# Patient Record
Sex: Female | Born: 1955 | Race: Black or African American | Hispanic: No | Marital: Single | Smoking: Never smoker
Health system: Southern US, Community
[De-identification: ages and names within clinical notes are randomized; demographics above are authoritative.]

## PROBLEM LIST (undated history)

## (undated) DIAGNOSIS — I10 Essential (primary) hypertension: Secondary | ICD-10-CM

---

## 2019-12-17 ENCOUNTER — Other Ambulatory Visit: Payer: Self-pay

## 2019-12-17 ENCOUNTER — Emergency Department (HOSPITAL_COMMUNITY): Payer: Self-pay

## 2019-12-17 ENCOUNTER — Encounter (HOSPITAL_COMMUNITY): Payer: Self-pay | Admitting: Emergency Medicine

## 2019-12-17 ENCOUNTER — Inpatient Hospital Stay (HOSPITAL_COMMUNITY)
Admission: EM | Admit: 2019-12-17 | Discharge: 2019-12-22 | DRG: 025 | Disposition: A | Discharge status: Home / Self Care | Payer: Self-pay | Attending: Neurological Surgery | Admitting: Neurological Surgery

## 2019-12-17 DIAGNOSIS — G911 Obstructive hydrocephalus: Secondary | ICD-10-CM | POA: Diagnosis present

## 2019-12-17 DIAGNOSIS — R270 Ataxia, unspecified: Secondary | ICD-10-CM | POA: Diagnosis present

## 2019-12-17 DIAGNOSIS — G935 Compression of brain: Secondary | ICD-10-CM | POA: Diagnosis present

## 2019-12-17 DIAGNOSIS — D496 Neoplasm of unspecified behavior of brain: Secondary | ICD-10-CM | POA: Diagnosis present

## 2019-12-17 DIAGNOSIS — D320 Benign neoplasm of cerebral meninges: Principal | ICD-10-CM | POA: Diagnosis present

## 2019-12-17 DIAGNOSIS — M25552 Pain in left hip: Secondary | ICD-10-CM | POA: Diagnosis present

## 2019-12-17 DIAGNOSIS — I10 Essential (primary) hypertension: Secondary | ICD-10-CM | POA: Diagnosis present

## 2019-12-17 DIAGNOSIS — R9089 Other abnormal findings on diagnostic imaging of central nervous system: Secondary | ICD-10-CM

## 2019-12-17 DIAGNOSIS — G9389 Other specified disorders of brain: Secondary | ICD-10-CM | POA: Diagnosis present

## 2019-12-17 DIAGNOSIS — Z20822 Contact with and (suspected) exposure to covid-19: Secondary | ICD-10-CM | POA: Diagnosis present

## 2019-12-17 HISTORY — DX: Essential (primary) hypertension: I10

## 2019-12-17 LAB — COMPREHENSIVE METABOLIC PANEL
ALT: 20 U/L (ref 0–44)
AST: 22 U/L (ref 15–41)
Albumin: 3.7 g/dL (ref 3.5–5.0)
Alkaline Phosphatase: 131 U/L — ABNORMAL HIGH (ref 38–126)
Anion gap: 10 (ref 5–15)
BUN: 14 mg/dL (ref 8–23)
CO2: 23 mmol/L (ref 22–32)
Calcium: 8.8 mg/dL — ABNORMAL LOW (ref 8.9–10.3)
Chloride: 108 mmol/L (ref 98–111)
Creatinine, Ser: 0.58 mg/dL (ref 0.44–1.00)
GFR calc Af Amer: >60 mL/min (ref >60)
GFR calc non Af Amer: >60 mL/min (ref >60)
Glucose, Bld: 168 mg/dL — ABNORMAL HIGH (ref 70–99)
Potassium: 3.4 mmol/L — ABNORMAL LOW (ref 3.5–5.1)
Sodium: 141 mmol/L (ref 135–145)
Total Bilirubin: 0.4 mg/dL (ref 0.3–1.2)
Total Protein: 7.8 g/dL (ref 6.5–8.1)

## 2019-12-17 LAB — CBC WITH DIFFERENTIAL/PLATELET
Abs Immature Granulocytes: 0.04 10*3/uL (ref 0.00–0.07)
Basophils Absolute: 0 10*3/uL (ref 0.0–0.1)
Basophils Relative: 0 %
Eosinophils Absolute: 0 10*3/uL (ref 0.0–0.5)
Eosinophils Relative: 0 %
HCT: 38.7 % (ref 36.0–46.0)
Hemoglobin: 12.8 g/dL (ref 12.0–15.0)
Immature Granulocytes: 0 %
Lymphocytes Relative: 17 %
Lymphs Abs: 1.8 10*3/uL (ref 0.7–4.0)
MCH: 25.1 pg — ABNORMAL LOW (ref 26.0–34.0)
MCHC: 33.1 g/dL (ref 30.0–36.0)
MCV: 76 fL — ABNORMAL LOW (ref 80.0–100.0)
Monocytes Absolute: 1 10*3/uL (ref 0.1–1.0)
Monocytes Relative: 9 %
Neutro Abs: 7.7 10*3/uL (ref 1.7–7.7)
Neutrophils Relative %: 74 %
Platelets: 279 10*3/uL (ref 150–400)
RBC: 5.09 MIL/uL (ref 3.87–5.11)
RDW: 13.2 % (ref 11.5–15.5)
WBC: 10.6 10*3/uL — ABNORMAL HIGH (ref 4.0–10.5)
nRBC: 0 % (ref 0.0–0.2)

## 2019-12-17 LAB — SARS CORONAVIRUS 2 BY RT PCR (HOSPITAL ORDER, PERFORMED IN ~~LOC~~ HOSPITAL LAB): SARS Coronavirus 2: NEGATIVE

## 2019-12-17 LAB — SURGICAL PCR SCREEN
MRSA, PCR: NEGATIVE
Staphylococcus aureus: NEGATIVE

## 2019-12-17 MED ORDER — DEXAMETHASONE SODIUM PHOSPHATE 10 MG/ML IJ SOLN
10.0000 mg | Freq: Once | INTRAMUSCULAR | Status: AC
  Administered 2019-12-17: 10 mg via INTRAVENOUS
  Filled 2019-12-17: qty 1

## 2019-12-17 MED ORDER — MORPHINE SULFATE (PF) 4 MG/ML IV SOLN
4.0000 mg | Freq: Once | INTRAVENOUS | Status: AC
  Administered 2019-12-17: 4 mg via INTRAVENOUS
  Filled 2019-12-17: qty 1

## 2019-12-17 MED ORDER — CHLORHEXIDINE GLUCONATE CLOTH 2 % EX PADS
6.0000 | MEDICATED_PAD | Freq: Every day | CUTANEOUS | Status: DC
  Administered 2019-12-17 – 2019-12-21 (×5): 6 via TOPICAL

## 2019-12-17 MED ORDER — HYDROMORPHONE HCL 1 MG/ML IJ SOLN
1.0000 mg | Freq: Once | INTRAMUSCULAR | Status: AC
  Administered 2019-12-17: 1 mg via INTRAVENOUS
  Filled 2019-12-17: qty 1

## 2019-12-17 MED ORDER — ONDANSETRON HCL 4 MG/2ML IJ SOLN
4.0000 mg | Freq: Once | INTRAMUSCULAR | Status: AC
  Administered 2019-12-17: 4 mg via INTRAVENOUS
  Filled 2019-12-17: qty 2

## 2019-12-17 NOTE — ED Notes (Signed)
Report attempted to call report to floor but no answer.

## 2019-12-17 NOTE — ED Notes (Signed)
Patient is also complaining of left hip pain. Patient state she step off wrong on the side walk. Patient state she is having difficulties put weight on it.

## 2019-12-17 NOTE — ED Notes (Signed)
Request for [facility] transport per Dr Kathrynn Humble completed. [employer]

## 2019-12-17 NOTE — ED Notes (Signed)
Pt repositioned in the bed. Pt has snack at bedside

## 2019-12-17 NOTE — ED Notes (Signed)
Report given to Jasa from [employer].

## 2019-12-17 NOTE — ED Triage Notes (Signed)
Patient here from home reporting headache x2 days. Seen by PCP with no relief. Daughter interpreter. Denies n/v,  AAO x4. Denies falling.

## 2019-12-17 NOTE — ED Notes (Signed)
ED TO INPATIENT HANDOFF REPORT  ED Nurse Name and Phone #: [phone]  S Name/Age/Gender Belinda Berg 64 y.o. female Room/Bed: WA15/WA15  Code Status   Code Status: Not on file  Home/SNF/Other Home Patient oriented to: self, place, time and situation Is this baseline? Yes   Triage Complete: Triage complete  Chief Complaint Brain tumor [facility]) [D49.6]  Triage Note Patient here from home reporting headache x2 days. Seen by PCP with no relief. Daughter interpreter. Denies n/v,  AAO x4. Denies falling.     Allergies No Known Allergies  Level of Care/Admitting Diagnosis ED Disposition    ED Disposition Condition [facility]: [place] [[id number]]  Level of Care: ICU [6]  May admit patient to [facility] or [facility] if equivalent level of care is available:: No  Covid Evaluation: Asymptomatic Screening Protocol (No Symptoms)  [facility]: [id number]  Admitting Physician: [place], [place]  Attending Physician: Erline Levine [[id number]]  Estimated length of stay: past midnight tomorrow  Certification:: I certify this patient will need inpatient services for at least 2 midnights       B Medical/Surgery History Past Medical History:  Diagnosis Date  . Hypertension    History reviewed. No pertinent surgical history.   A IV Location/Drains/Wounds Patient Lines/Drains/Airways Status   Active Line/Drains/Airways    Name:   Placement date:   Placement time:   Site:   Days:   Peripheral IV 12/17/19 Left Antecubital   12/17/19    1702    Antecubital   less than 1          Intake/Output Last 24 hours No intake or output data in the 24 hours ending 12/17/19 1915  Labs/Imaging Results for orders placed or performed during the hospital encounter of 12/17/19 (from the past 48 hour(s))  Comprehensive metabolic panel     Status: Abnormal   Collection Time: 12/17/19  5:02 PM  Result Value Ref Range   Sodium 141 135 - 145  mmol/L   Potassium 3.4 (L) 3.5 - 5.1 mmol/L   Chloride 108 98 - 111 mmol/L   CO2 23 22 - 32 mmol/L   Glucose, Bld 168 (H) 70 - 99 mg/dL    Comment: Glucose reference range applies only to samples taken after fasting for at least 8 hours.   BUN 14 8 - 23 mg/dL   Creatinine, Ser 0.58 0.44 - 1.00 mg/dL   Calcium 8.8 (L) 8.9 - 10.3 mg/dL   Total Protein 7.8 6.5 - 8.1 g/dL   Albumin 3.7 3.5 - 5.0 g/dL   AST 22 15 - 41 U/L   ALT 20 0 - 44 U/L   Alkaline Phosphatase 131 (H) 38 - 126 U/L   Total Bilirubin 0.4 0.3 - 1.2 mg/dL   GFR calc non Af Amer >60 >60 mL/min   GFR calc Af Amer >60 >60 mL/min   Anion gap 10 5 - 15    Comment: Performed at [facility], [place] [address] [place], [place] [address]  CBC with Differential     Status: Abnormal   Collection Time: 12/17/19  5:02 PM  Result Value Ref Range   WBC 10.6 (H) 4.0 - 10.5 K/uL   RBC 5.09 3.87 - 5.11 MIL/uL   Hemoglobin 12.8 12.0 - 15.0 g/dL   HCT 38.7 36.0 - 46.0 %   MCV 76.0 (L) 80.0 - 100.0 fL   MCH 25.1 (L) 26.0 - 34.0 pg  MCHC 33.1 30.0 - 36.0 g/dL   RDW 13.2 11.5 - 15.5 %   Platelets 279 150 - 400 K/uL   nRBC 0.0 0.0 - 0.2 %   Neutrophils Relative % 74 %   Neutro Abs 7.7 1.7 - 7.7 K/uL   Lymphocytes Relative 17 %   Lymphs Abs 1.8 0.7 - 4.0 K/uL   Monocytes Relative 9 %   Monocytes Absolute 1.0 0.1 - 1.0 K/uL   Eosinophils Relative 0 %   Eosinophils Absolute 0.0 0.0 - 0.5 K/uL   Basophils Relative 0 %   Basophils Absolute 0.0 0.0 - 0.1 K/uL   Immature Granulocytes 0 %   Abs Immature Granulocytes 0.04 0.00 - 0.07 K/uL    Comment: Performed at [facility], [place] [address] [place], [place] [address]   CT Head Wo Contrast  Result Date: 12/17/2019 CLINICAL DATA:  Headache. EXAM: CT HEAD WITHOUT CONTRAST TECHNIQUE: Contiguous axial images were obtained from the base of the skull through the vertex without intravenous contrast. COMPARISON:  None. FINDINGS: Brain: There is a large round  partially calcified mass within the RIGHT cerebellum measuring 3.6 by 3.8 by 3.5 cm. The mass has dense central calcification and wispy peripheral calcification. Mass is fairly well-circumscribed and abuts the dural surfaces. There is mass effect exerted by the calcified lesion with vasogenic edema within the RIGHT cerebellum and compression of the fourth ventricle. Additionally there is moderate to severe hydrocephalus of the lateral ventricles and third ventricle. Vascular: No hyperdense vessel or unexpected calcification. Skull: No clear bony changes adjacent to the partially calcified RIGHT cerebellar mass. Sinuses/Orbits: Unremarkable Other: none IMPRESSION: 1. 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 Mass exerts mass effect with compression of the fourth ventricle with moderate to severe hydronephrosis of the lateral ventricles and third ventricle. Recommend neuro surgical consultation. 2. No clear bony changes adjacent to the RIGHT cerebral mass. Mass does abut the dural surface therefore meningioma is a consideration however other benign and malignant primary brain lesions need to be considered. Recommend MRI with and without contrast for further evaluation. Critical Value/emergent results were called by telephone at the time of interpretation on 12/17/2019 at 5:33 pm to provider [facility] PATEL , who verbally acknowledged these results. Electronically Signed   By: Suzy Bouchard M.D.   On: 12/17/2019 17:34   DG Hip Unilat With Pelvis 2-3 Views Left  Result Date: 12/17/2019 CLINICAL DATA:  64 year old female with left hip pain. EXAM: DG HIP (WITH OR WITHOUT PELVIS) 2-3V LEFT COMPARISON:  None. FINDINGS: There is no acute fracture or dislocation. No significant arthritic changes. The soft tissues are unremarkable. IMPRESSION: Negative. Electronically Signed   By: Anner Crete M.D.   On: 12/17/2019 17:26    Pending Labs Unresulted Labs (From admission, onward)    Start     Ordered    12/17/19 1827  SARS Coronavirus 2 by RT PCR (hospital order, performed in [facility] hospital lab) Nasopharyngeal Nasopharyngeal Swab  (Tier 2 (TAT 2 hrs))  Once,   STAT    Question Answer Comment  Is this test for diagnosis or screening Screening   Symptomatic for COVID-19 as defined by CDC No   Hospitalized for COVID-19 No   Admitted to ICU for COVID-19 No   Previously tested for COVID-19 No   Resident in a congregate (group) care setting No   Employed in healthcare setting No   Pregnant No   Has patient completed COVID vaccination(s) (2 doses of Pfizer/Moderna 1 dose  of Toma Aran) Unknown      12/17/19 1826          Vitals/Pain Today's Vitals   12/17/19 1756 12/17/19 1800 12/17/19 1840 12/17/19 1852  BP: (!) 154/95 (!) 155/98 (!) 155/98   Pulse: 96 94 94   Resp: 20 20 16    Temp:      TempSrc:      SpO2: 98% 97% 96%   Height:      PainSc:    10-Worst pain ever    Isolation Precautions No active isolations  Medications Medications  morphine 4 MG/ML injection 4 mg (4 mg Intravenous Given 12/17/19 1729)  dexamethasone (DECADRON) injection 10 mg (10 mg Intravenous Given 12/17/19 1852)  HYDROmorphone (DILAUDID) injection 1 mg (1 mg Intravenous Given 12/17/19 1851)    Mobility walks Low fall risk   Focused Assessments .   R Recommendations: See Admitting Provider Note  Report given to:   Additional Notes: n/a

## 2019-12-17 NOTE — ED Provider Notes (Signed)
[place] DEPT Provider Note   CSN: [id number] Arrival date & time: 12/17/19  1102     History Chief Complaint  Patient presents with  . Headache    Belinda Berg is a 64 y.o. female with pertinent past medical history of hypertension that presents the emergency department today for headache and left hip pain.  Patient states that she has been having a headache that is been progressing and worsening over the past week, states that this is a new headache.  States that headache is in the front of her head and the back of her head.  Also admits to 2 episodes of vomiting one last night and one this morning.  No hematemesis or coffee-ground emesis.  Denies any nausea right now.  Patient has been taking blood pressure medications as prescribed.  Is compliant.  States that she has been taking her blood pressure at home and has been around [id number] systolic.  This is normal for her.  Patient states that she has no vision changes, diplopia, gait changes, weakness, dizziness, lightheadedness, syncope, trauma to the head, fevers, chills, paresthesias, abdominal pain.  States she is been eating normally drinking normally.  No dysuria or hematuria.  Normal stools.  Denies any chest pain or shortness of breath.  Patient does not have a history of migraines.  In regards to left hip, patient states that she was walking and missed a step on a curb and started having left hip pain.  Denies any falls.  Is not any blood thinners.  Denies any sensation of popping noise to hip.  Denies any pain radiation down her leg.  Denies any back pain.  Is ambulatory.  Has never injured that hip before.    HPI     Past Medical History:  Diagnosis Date  . Hypertension     There are no problems to display for this patient.   History reviewed. No pertinent surgical history.   OB History   No obstetric history on file.     No family history on file.  Social History   Tobacco Use  .  Smoking status: Never Smoker  . Smokeless tobacco: Never Used  Substance Use Topics  . Alcohol use: Never  . Drug use: Never    Home Medications Prior to Admission medications   Medication Sig Start Date End Date Taking? Authorizing Provider  amLODipine (NORVASC) 10 MG tablet Take 10 mg by mouth daily.   Yes [provider]  atorvastatin (LIPITOR) 40 MG tablet Take 40 mg by mouth daily.   Yes [provider]  carvedilol (COREG) 3.125 MG tablet Take 3.125 mg by mouth 2 (two) times daily with a meal.   Yes [provider]  ibuprofen (ADVIL) 200 MG tablet Take 400 mg by mouth every 6 (six) hours as needed for moderate pain.   Yes [provider]  Multiple Vitamin (MULTIVITAMIN WITH MINERALS) TABS tablet Take 1 tablet by mouth daily.   Yes [provider]  omeprazole (PRILOSEC) 20 MG capsule Take 20 mg by mouth daily.   Yes [provider]    Allergies    Patient has no known allergies.  Review of Systems   Review of Systems  Constitutional: Negative for chills, diaphoresis, fatigue and fever.  HENT: Negative for congestion, sore throat and trouble swallowing.   Eyes: Negative for pain and visual disturbance.  Respiratory: Negative for cough, shortness of breath and wheezing.   Cardiovascular: Negative for chest pain, palpitations and  leg swelling.  Gastrointestinal: Negative for abdominal distention, abdominal pain, diarrhea, nausea and vomiting.  Genitourinary: Negative for difficulty urinating.  Musculoskeletal: Positive for arthralgias (L hip pain). Negative for back pain, neck pain and neck stiffness.  Skin: Negative for pallor.  Neurological: Positive for headaches. Negative for dizziness, tremors, seizures, syncope, facial asymmetry, speech difficulty, weakness, light-headedness and numbness.  Psychiatric/Behavioral: Negative for confusion.    Physical Exam Updated Vital Signs BP (!) 154/95   Pulse 96   Temp 98.2 F  (36.8 C) (Oral)   Resp 20   Ht 5' (1.524 m)   SpO2 98%   Physical Exam Constitutional:      General: She is not in acute distress.    Appearance: Normal appearance. She is not ill-appearing, toxic-appearing or diaphoretic.  HENT:     Mouth/Throat:     Mouth: Mucous membranes are moist.     Pharynx: Oropharynx is clear.  Eyes:     General: No visual field deficit or scleral icterus.    Extraocular Movements: Extraocular movements intact.     Pupils: Pupils are equal, round, and reactive to light.  Cardiovascular:     Rate and Rhythm: Normal rate and regular rhythm.     Pulses: Normal pulses.     Heart sounds: Normal heart sounds.  Pulmonary:     Effort: Pulmonary effort is normal. No respiratory distress.     Breath sounds: Normal breath sounds. No stridor. No wheezing, rhonchi or rales.  Chest:     Chest wall: No tenderness.  Abdominal:     General: Abdomen is flat. There is no distension.     Palpations: Abdomen is soft.     Tenderness: There is no abdominal tenderness. There is no guarding or rebound.  Musculoskeletal:        General: No swelling or tenderness. Normal range of motion.     Cervical back: Normal range of motion and neck supple. No rigidity.     Right lower leg: No edema.     Left lower leg: No edema.     Comments: Patient with tenderness to left hip.  No overlying signs of infection.  Patient is able to move hip in all directions.  Normal strength of hip, ankle.  Normal sensation.  Normal gait.  Skin:    General: Skin is warm and dry.     Capillary Refill: Capillary refill takes less than 2 seconds.     Coloration: Skin is not pale.  Neurological:     General: No focal deficit present.     Mental Status: She is alert and oriented to person, place, and time.     Cranial Nerves: Cranial nerves are intact. No cranial nerve deficit, dysarthria or facial asymmetry.     Sensory: Sensation is intact. No sensory deficit.     Motor: Motor function is intact. No  weakness, atrophy or seizure activity.     Coordination: Coordination is intact. Romberg sign negative. Coordination normal. Finger-Nose-Finger Test normal.     Gait: Gait is intact.     Comments: Alert. Clear speech. No facial droop. CNIII-XII grossly intact. Bilateral upper and lower extremities' sensation grossly intact. 5/5 symmetric strength with grip strength and with plantar and dorsi flexion bilaterally. Patellar DTRs are 2+ and symmetric . Normal finger to nose bilaterally. Negative pronator drift. Negative Romberg sign. Gait is steady and intact.  Patient with tenderness to forehead bilaterally with tenderness to occipital lobes bilaterally.  Psychiatric:  Mood and Affect: Mood normal.        Behavior: Behavior normal.     ED Results / Procedures / Treatments   Labs (all labs ordered are listed, but only abnormal results are displayed) Labs Reviewed  COMPREHENSIVE METABOLIC PANEL - Abnormal; Notable for the following components:      Result Value   Potassium 3.4 (*)    Glucose, Bld 168 (*)    Calcium 8.8 (*)    Alkaline Phosphatase 131 (*)    All other components within normal limits  CBC WITH DIFFERENTIAL/PLATELET - Abnormal; Notable for the following components:   WBC 10.6 (*)    MCV 76.0 (*)    MCH 25.1 (*)    All other components within normal limits    EKG None  Radiology CT Head Wo Contrast  Result Date: 12/17/2019 CLINICAL DATA:  Headache. EXAM: CT HEAD WITHOUT CONTRAST TECHNIQUE: Contiguous axial images were obtained from the base of the skull through the vertex without intravenous contrast. COMPARISON:  None. FINDINGS: Brain: There is a large round partially calcified mass within the RIGHT cerebellum measuring 3.6 by 3.8 by 3.5 cm. The mass has dense central calcification and wispy peripheral calcification. Mass is fairly well-circumscribed and abuts the dural surfaces. There is mass effect exerted by the calcified lesion with vasogenic edema within the  RIGHT cerebellum and compression of the fourth ventricle. Additionally there is moderate to severe hydrocephalus of the lateral ventricles and third ventricle. Vascular: No hyperdense vessel or unexpected calcification. Skull: No clear bony changes adjacent to the partially calcified RIGHT cerebellar mass. Sinuses/Orbits: Unremarkable Other: none IMPRESSION: 1. 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 Mass exerts mass effect with compression of the fourth ventricle with moderate to severe hydronephrosis of the lateral ventricles and third ventricle. Recommend neuro surgical consultation. 2. No clear bony changes adjacent to the RIGHT cerebral mass. Mass does abut the dural surface therefore meningioma is a consideration however other benign and malignant primary brain lesions need to be considered. Recommend MRI with and without contrast for further evaluation. Critical Value/emergent results were called by telephone at the time of interpretation on 12/17/2019 at 5:33 pm to provider Belinda Berg , who verbally acknowledged these results. Electronically Signed   By: Suzy Bouchard M.D.   On: 12/17/2019 17:34   DG Hip Unilat With Pelvis 2-3 Views Left  Result Date: 12/17/2019 CLINICAL DATA:  64 year old female with left hip pain. EXAM: DG HIP (WITH OR WITHOUT PELVIS) 2-3V LEFT COMPARISON:  None. FINDINGS: There is no acute fracture or dislocation. No significant arthritic changes. The soft tissues are unremarkable. IMPRESSION: Negative. Electronically Signed   By: Anner Crete M.D.   On: 12/17/2019 17:26    Procedures Procedures (including critical care time)  Medications Ordered in ED Medications  morphine 4 MG/ML injection 4 mg (4 mg Intravenous Given 12/17/19 1729)    ED Course  I have reviewed the triage vital signs and the nursing notes.  Pertinent labs & imaging results that were available during my care of the patient were reviewed by me and considered in my medical decision  making (see chart for details).    MDM Rules/Calculators/A&P                     Sochil Dronen is a 64 y.o. female with pertinent past medical history of hypertension that presents the emergency department today for headache and hip pain.  Normal neuro exam. Pt is ambulatory.  Initial interventions Morphine  for pain.   Ct head showed large round partially calcified right cerebellar mass with mass-effect and compression of the fourth ventricle with severe hydronephrosis of the lateral ventricles and third ventricle.  Will consult neurosurgery at this time.  Discussed the findings with patient's daughter in depth.  Expressed that we need more information and to speak to neurosurgery at this time.  Daughter is tearful and does not want to tell mom.  Expressed to daughter that we need to legally tell her mom, spoke to patient's son who was reasonable and agreeable to tell mom.  Daughter translated to mom the findings of the CAT scan and need for further evaluation at this time.  Mom agreeable.  Does not have any questions at this time.  Expressed that she may be in the hospital for a while due to the tumor size.  Daughter and patient both agreeable at this time.  Upon reassessment pain has been controlled with morphine.  Negative x-rays of the hip.  Labs stable at this time. Marland Kitchen  6:25 PM discussed case with Dr. Vertell Limber, neurosurgeon who wants pt's admitted to Neuro ICU.   Pt care was handed off to Dr. Kathrynn Humble who will follow up with admission.  Complete history and physical and current plan have been communicated.  Please refer to their note for the remainder of ED care and ultimate disposition.   Final Clinical Impression(s) / ED Diagnoses Final diagnoses:  Cerebellar mass  Abnormal CT of brain    Rx / DC Orders ED Discharge Orders    None       Alfredia Client, PA-C 12/17/19 2042    Varney Biles, MD 12/18/19 1529

## 2019-12-17 NOTE — ED Notes (Signed)
[facility] here for transport of pt to [facility]

## 2019-12-18 ENCOUNTER — Inpatient Hospital Stay (HOSPITAL_COMMUNITY): Payer: Self-pay

## 2019-12-18 LAB — COMPREHENSIVE METABOLIC PANEL
ALT: 20 U/L (ref 0–44)
AST: 21 U/L (ref 15–41)
Albumin: 3.4 g/dL — ABNORMAL LOW (ref 3.5–5.0)
Alkaline Phosphatase: 127 U/L — ABNORMAL HIGH (ref 38–126)
Anion gap: 11 (ref 5–15)
BUN: 18 mg/dL (ref 8–23)
CO2: 23 mmol/L (ref 22–32)
Calcium: 9.2 mg/dL (ref 8.9–10.3)
Chloride: 107 mmol/L (ref 98–111)
Creatinine, Ser: 0.66 mg/dL (ref 0.44–1.00)
GFR calc Af Amer: >60 mL/min (ref >60)
GFR calc non Af Amer: >60 mL/min (ref >60)
Glucose, Bld: 162 mg/dL — ABNORMAL HIGH (ref 70–99)
Potassium: 3.6 mmol/L (ref 3.5–5.1)
Sodium: 141 mmol/L (ref 135–145)
Total Bilirubin: 0.6 mg/dL (ref 0.3–1.2)
Total Protein: 8.1 g/dL (ref 6.5–8.1)

## 2019-12-18 LAB — CBC
HCT: 39.7 % (ref 36.0–46.0)
Hemoglobin: 13 g/dL (ref 12.0–15.0)
MCH: 25.2 pg — ABNORMAL LOW (ref 26.0–34.0)
MCHC: 32.7 g/dL (ref 30.0–36.0)
MCV: 77.1 fL — ABNORMAL LOW (ref 80.0–100.0)
Platelets: 297 10*3/uL (ref 150–400)
RBC: 5.15 MIL/uL — ABNORMAL HIGH (ref 3.87–5.11)
RDW: 13.5 % (ref 11.5–15.5)
WBC: 13.2 10*3/uL — ABNORMAL HIGH (ref 4.0–10.5)
nRBC: 0 % (ref 0.0–0.2)

## 2019-12-18 LAB — HIV ANTIBODY (ROUTINE TESTING W REFLEX): HIV Screen 4th Generation wRfx: NONREACTIVE

## 2019-12-18 MED ORDER — HYDROMORPHONE HCL 1 MG/ML IJ SOLN
0.5000 mg | INTRAMUSCULAR | Status: DC | PRN
  Administered 2019-12-18 – 2019-12-20 (×4): 1 mg via INTRAVENOUS
  Filled 2019-12-18 (×4): qty 1

## 2019-12-18 MED ORDER — ATORVASTATIN CALCIUM 40 MG PO TABS
40.0000 mg | ORAL_TABLET | Freq: Every day | ORAL | Status: DC
  Administered 2019-12-18 – 2019-12-22 (×4): 40 mg via ORAL
  Filled 2019-12-18 (×5): qty 1

## 2019-12-18 MED ORDER — POLYETHYLENE GLYCOL 3350 17 G PO PACK
17.0000 g | PACK | Freq: Every day | ORAL | Status: DC | PRN
  Administered 2019-12-22: 17 g via ORAL
  Filled 2019-12-18: qty 1

## 2019-12-18 MED ORDER — POTASSIUM CHLORIDE IN NACL 20-0.9 MEQ/L-% IV SOLN
INTRAVENOUS | Status: DC
  Filled 2019-12-18: qty 1000

## 2019-12-18 MED ORDER — DOCUSATE SODIUM 100 MG PO CAPS
100.0000 mg | ORAL_CAPSULE | Freq: Two times a day (BID) | ORAL | Status: DC
  Administered 2019-12-18 – 2019-12-22 (×8): 100 mg via ORAL
  Filled 2019-12-18 (×8): qty 1

## 2019-12-18 MED ORDER — GADOBUTROL 1 MMOL/ML IV SOLN
7.0000 mL | Freq: Once | INTRAVENOUS | Status: AC | PRN
  Administered 2019-12-18: 7 mL via INTRAVENOUS

## 2019-12-18 MED ORDER — PANTOPRAZOLE SODIUM 40 MG PO TBEC
40.0000 mg | DELAYED_RELEASE_TABLET | Freq: Every day | ORAL | Status: DC
  Administered 2019-12-18 – 2019-12-22 (×4): 40 mg via ORAL
  Filled 2019-12-18 (×4): qty 1

## 2019-12-18 MED ORDER — FLEET ENEMA 7-19 GM/118ML RE ENEM: 1.0000 | ENEMA | Freq: Once | RECTAL | Status: DC | PRN

## 2019-12-18 MED ORDER — CARVEDILOL 3.125 MG PO TABS
3.1250 mg | ORAL_TABLET | Freq: Two times a day (BID) | ORAL | Status: DC
  Administered 2019-12-18 – 2019-12-22 (×9): 3.125 mg via ORAL
  Filled 2019-12-18 (×9): qty 1

## 2019-12-18 MED ORDER — ACETAMINOPHEN 650 MG RE SUPP: 650.0000 mg | Freq: Four times a day (QID) | RECTAL | Status: DC | PRN

## 2019-12-18 MED ORDER — HYDROCODONE-ACETAMINOPHEN 5-325 MG PO TABS
1.0000 | ORAL_TABLET | ORAL | Status: DC | PRN
  Administered 2019-12-18 – 2019-12-22 (×10): 2 via ORAL
  Filled 2019-12-18 (×10): qty 2

## 2019-12-18 MED ORDER — ACETAMINOPHEN 325 MG PO TABS
650.0000 mg | ORAL_TABLET | Freq: Four times a day (QID) | ORAL | Status: DC | PRN
  Administered 2019-12-19 – 2019-12-20 (×3): 650 mg via ORAL
  Filled 2019-12-18 (×3): qty 2

## 2019-12-18 MED ORDER — DEXAMETHASONE SODIUM PHOSPHATE 4 MG/ML IJ SOLN
4.0000 mg | Freq: Four times a day (QID) | INTRAMUSCULAR | Status: AC
  Administered 2019-12-18 – 2019-12-20 (×11): 4 mg via INTRAVENOUS
  Filled 2019-12-18 (×11): qty 1

## 2019-12-18 MED ORDER — AMLODIPINE BESYLATE 10 MG PO TABS
10.0000 mg | ORAL_TABLET | Freq: Every day | ORAL | Status: DC
  Administered 2019-12-18 – 2019-12-22 (×5): 10 mg via ORAL
  Filled 2019-12-18 (×5): qty 1

## 2019-12-18 MED ORDER — ONDANSETRON HCL 4 MG PO TABS: 4.0000 mg | ORAL_TABLET | Freq: Four times a day (QID) | ORAL | Status: DC | PRN

## 2019-12-18 MED ORDER — BISACODYL 10 MG RE SUPP: 10.0000 mg | Freq: Every day | RECTAL | Status: DC | PRN

## 2019-12-18 MED ORDER — ADULT MULTIVITAMIN W/MINERALS CH
1.0000 | ORAL_TABLET | Freq: Every day | ORAL | Status: DC
  Administered 2019-12-19 – 2019-12-22 (×3): 1 via ORAL
  Filled 2019-12-18 (×3): qty 1

## 2019-12-18 MED ORDER — ONDANSETRON HCL 4 MG/2ML IJ SOLN: 4.0000 mg | Freq: Four times a day (QID) | INTRAMUSCULAR | Status: DC | PRN

## 2019-12-18 NOTE — Progress Notes (Signed)
Called MRI, spoke to [facility], told her we are ready to come down for MRI as soon as they can accomidate [place]. They then called back and asked for her family to come in as interpreter in person. I will call family now.

## 2019-12-18 NOTE — H&P (Signed)
Reason for Consult:cerebellar mass and obstructive hydrocephalus. Referring Physician: Yeraldi Albani is an 64 y.o. female.  HPI: Belinda Berg is an 64 year old female who presented to [facility] emergency department for a 10/10 headache. A CT head was performed at [facility] and showed a large calcified mass in the right cerebellar hemisphere with compression of the 4th ventricle and obstructive hydrocephalus which subsequently lead to a neurosurgical evaluation. The patient was then transferred to neuro ICU at [facility].     The patient is non-English speaking. The patient's daughter Minette Brine is present in the room translating during the exam and providing additional history.  She is from [place] and speaks [place].  About one week ago the patient was walking and lost her balance which caused her to stumble but not fall. After her misstep, she began to have pain in her left hip. She began taking acetaminophen and ibuprofen for the hip pain. About two days ago, she began complaining of a severe headache as well as left hip pain. She continued taking OTC analgesics with little relief from the headache pain. The headache pain continued to increase in severity with a 10/10 pain which prompted a call to her PCP. The PCP instructed the patient to go to the ED and have a head CT. While in the ED, a radiograph of her pelvis was done as well as a CT head.     While in the ED, she received 10 mg of Decadron, 4 mg of morphine, and 1 mg of dilaudud which resulted in a slight decrease in her headache. She had one episode of emesis and 4 mg of Zofran was given. Upon questioning the patient, she reported having headaches for the last five years but not as severe as the last few days. She denies any other neurological deficits.    On discussion with the patient, she admits to progressively worsening headache over the last five years.    Past Medical History:  Diagnosis Date  . Hypertension      History reviewed. No pertinent surgical history.  No family history on file.  Social History:  reports that she has never smoked. She has never used smokeless tobacco. She reports that she does not drink alcohol or use drugs.  Allergies: No Known Allergies  Medications: I have reviewed the patient's current medications.  Results for orders placed or performed during the hospital encounter of 12/17/19 (from the past 48 hour(s))  Comprehensive metabolic panel     Status: Abnormal   Collection Time: 12/17/19  5:02 PM  Result Value Ref Range   Sodium 141 135 - 145 mmol/L   Potassium 3.4 (L) 3.5 - 5.1 mmol/L   Chloride 108 98 - 111 mmol/L   CO2 23 22 - 32 mmol/L   Glucose, Bld 168 (H) 70 - 99 mg/dL    Comment: Glucose reference range applies only to samples taken after fasting for at least 8 hours.   BUN 14 8 - 23 mg/dL   Creatinine, Ser 0.58 0.44 - 1.00 mg/dL   Calcium 8.8 (L) 8.9 - 10.3 mg/dL   Total Protein 7.8 6.5 - 8.1 g/dL   Albumin 3.7 3.5 - 5.0 g/dL   AST 22 15 - 41 U/L   ALT 20 0 - 44 U/L   Alkaline Phosphatase 131 (H) 38 - 126 U/L   Total Bilirubin 0.4 0.3 - 1.2 mg/dL   GFR calc non Af Amer >60 >60 mL/min   GFR calc Af Amer >  60 >60 mL/min   Anion gap 10 5 - 15    Comment: Performed at [facility], [place] [address], [place], [place] [address]  CBC with Differential     Status: Abnormal   Collection Time: 12/17/19  5:02 PM  Result Value Ref Range   WBC 10.6 (H) 4.0 - 10.5 K/uL   RBC 5.09 3.87 - 5.11 MIL/uL   Hemoglobin 12.8 12.0 - 15.0 g/dL   HCT 38.7 36.0 - 46.0 %   MCV 76.0 (L) 80.0 - 100.0 fL   MCH 25.1 (L) 26.0 - 34.0 pg   MCHC 33.1 30.0 - 36.0 g/dL   RDW 13.2 11.5 - 15.5 %   Platelets 279 150 - 400 K/uL   nRBC 0.0 0.0 - 0.2 %   Neutrophils Relative % 74 %   Neutro Abs 7.7 1.7 - 7.7 K/uL   Lymphocytes Relative 17 %   Lymphs Abs 1.8 0.7 - 4.0 K/uL   Monocytes Relative 9 %   Monocytes Absolute 1.0 0.1 - 1.0 K/uL   Eosinophils Relative 0  %   Eosinophils Absolute 0.0 0.0 - 0.5 K/uL   Basophils Relative 0 %   Basophils Absolute 0.0 0.0 - 0.1 K/uL   Immature Granulocytes 0 %   Abs Immature Granulocytes 0.04 0.00 - 0.07 K/uL    Comment: Performed at [facility], [place] [address] [place], [place] [address]  SARS Coronavirus 2 by RT PCR (hospital order, performed in [facility] hospital lab) Nasopharyngeal Nasopharyngeal Swab     Status: None   Collection Time: 12/17/19  7:17 PM   Specimen: Nasopharyngeal Swab  Result Value Ref Range   SARS Coronavirus 2 NEGATIVE NEGATIVE    Comment: (NOTE) SARS-CoV-2 target nucleic acids are NOT DETECTED. The SARS-CoV-2 RNA is generally detectable in upper and lower respiratory specimens during the acute phase of infection. The lowest concentration of SARS-CoV-2 viral copies this assay can detect is 250 copies / mL. A negative result does not preclude SARS-CoV-2 infection and should not be used as the sole basis for treatment or other patient management decisions.  A negative result may occur with improper specimen collection / handling, submission of specimen other than nasopharyngeal swab, presence of viral mutation(s) within the areas targeted by this assay, and inadequate number of viral copies (<250 copies / mL). A negative result must be combined with clinical observations, patient history, and epidemiological information. Fact Sheet for Patients:   StrictlyIdeas.no Fact Sheet for Healthcare Providers: BankingDealers.co.za This test is not yet approved or cleared  by the [place] FDA and has been authorized for detection and/or diagnosis of SARS-CoV-2 by FDA under an Emergency Use Authorization (EUA).  This EUA will remain in effect (meaning this test can be used) for the duration of the COVID-19 declaration under Section 564(b)(1) of the Act, 21 U.S.C. section 360bbb-3(b)(1), unless the authorization is  terminated or revoked sooner. Performed at [facility], [place] [address] [place], [place] [address]   Surgical pcr screen     Status: None   Collection Time: 12/17/19  8:45 PM   Specimen: Nasal Mucosa; Nasal Swab  Result Value Ref Range   MRSA, PCR NEGATIVE NEGATIVE   Staphylococcus aureus NEGATIVE NEGATIVE    Comment: (NOTE) The Xpert SA Assay (FDA approved for NASAL specimens in patients 17 years of age and older), is one component of a comprehensive surveillance program. It is not intended to diagnose infection nor to guide or monitor treatment.  Performed at [facility], [place] [address] [place],  [address]     CT Head Wo Contrast  Result Date: 12/17/2019 CLINICAL DATA:  Headache. EXAM: CT HEAD WITHOUT CONTRAST TECHNIQUE: Contiguous axial images were obtained from the base of the skull through the vertex without intravenous contrast. COMPARISON:  None. FINDINGS: Brain: There is a large round partially calcified mass within the RIGHT cerebellum measuring 3.6 by 3.8 by 3.5 cm. The mass has dense central calcification and wispy peripheral calcification. Mass is fairly well-circumscribed and abuts the dural surfaces. There is mass effect exerted by the calcified lesion with vasogenic edema within the RIGHT cerebellum and compression of the fourth ventricle. Additionally there is moderate to severe hydrocephalus of the lateral ventricles and third ventricle. Vascular: No hyperdense vessel or unexpected calcification. Skull: No clear bony changes adjacent to the partially calcified RIGHT cerebellar mass. Sinuses/Orbits: Unremarkable Other: none IMPRESSION: 1. 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 Mass exerts mass effect with compression of the fourth ventricle with moderate to severe hydronephrosis of the lateral ventricles and third ventricle. Recommend neuro surgical consultation. 2. No clear bony changes adjacent to the RIGHT cerebral mass. Mass  does abut the dural surface therefore meningioma is a consideration however other benign and malignant primary brain lesions need to be considered. Recommend MRI with and without contrast for further evaluation. Critical Value/emergent results were called by telephone at the time of interpretation on 12/17/2019 at 5:33 pm to provider [facility] PATEL , who verbally acknowledged these results. Electronically Signed   By: Suzy Bouchard M.D.   On: 12/17/2019 17:34   DG Hip Unilat With Pelvis 2-3 Views Left  Result Date: 12/17/2019 CLINICAL DATA:  64 year old female with left hip pain. EXAM: DG HIP (WITH OR WITHOUT PELVIS) 2-3V LEFT COMPARISON:  None. FINDINGS: There is no acute fracture or dislocation. No significant arthritic changes. The soft tissues are unremarkable. IMPRESSION: Negative. Electronically Signed   By: Anner Crete M.D.   On: 12/17/2019 17:26    Review of Systems - Negative except per HPI    Blood pressure 131/89, pulse 86, temperature 97.8 F (36.6 C), temperature source Oral, resp. rate 15, height 5' (1.524 m), weight 72.8 kg, SpO2 95 %. Physical Exam  Constitutional: She is oriented to person, place, and time. She appears well-nourished.  HENT:  Head: Normocephalic and atraumatic.  Eyes: Pupils are equal, round, and reactive to light. EOM are normal.  Cardiovascular: Normal rate.  Respiratory: Effort normal and breath sounds normal.  GI: Soft. Bowel sounds are normal.  Musculoskeletal:        General: Normal range of motion.     Cervical back: Normal range of motion and neck supple.  Neurological: She is alert and oriented to person, place, and time. She has normal strength and normal reflexes. No cranial nerve deficit or sensory deficit. GCS eye subscore is 4. GCS verbal subscore is 5. GCS motor subscore is 6.  Patient is alert and oriented, headache is mild at this point.  She has no dysmetria.      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  She feels better after decadron and has no deficts on exam.  Plan is for cranial MRI, continue steroids with plan for sub-occipital craniectomy with tumor resection in the near future.    Peggyann Shoals, MD 12/18/2019, 12:42 AM

## 2019-12-18 NOTE — Progress Notes (Signed)
Belinda Berg at MRI notified that pt's daughter is here and that we are ready to come to MRI

## 2019-12-18 NOTE — Progress Notes (Signed)
Subjective: Patient reports doing well.  Headache better with decadron.  Objective: Vital signs in last 24 hours: Temp:  [97.8 F (36.6 C)-98.5 F (36.9 C)] 98.3 F (36.8 C) (05/31 0800) Pulse Rate:  [33-106] 92 (05/31 0900) Resp:  [13-26] 26 (05/31 0900) BP: (130-165)/(72-116) 131/94 (05/31 0900) SpO2:  [93 %-99 %] 95 % (05/31 0900) Weight:  [72.8 kg] 72.8 kg (05/30 2039)  Intake/Output from previous day: 05/30 0701 - 05/31 0700 In: 52.2 [I.V.:52.2] Out: -  Intake/Output this shift: No intake/output data recorded.  Physical Exam: Awake, alert, conversant.  MAEW without drift.  Lab Results: Recent Labs    12/17/19 1702 12/18/19 0622  WBC 10.6* 13.2*  HGB 12.8 13.0  HCT 38.7 39.7  PLT 279 297   BMET Recent Labs    12/17/19 1702 12/18/19 0622  NA 141 141  K 3.4* 3.6  CL 108 107  CO2 23 23  GLUCOSE 168* 162*  BUN 14 18  CREATININE 0.58 0.66  CALCIUM 8.8* 9.2    Studies/Results: CT Head Wo Contrast  Addendum Date: 12/18/2019   ADDENDUM REPORT: 12/18/2019 08:26 ADDENDUM: Dictation correction: First sentence of IMPRESSION 1., "hydrocephalus" replaces "hydronephrosis". First sentence of the IMPRESSION 2., "cerebellar" replaces "cerebral". Electronically Signed   By: Suzy Bouchard M.D.   On: 12/18/2019 08:26   Result Date: 12/18/2019 CLINICAL DATA:  Headache. EXAM: CT HEAD WITHOUT CONTRAST TECHNIQUE: Contiguous axial images were obtained from the base of the skull through the vertex without intravenous contrast. COMPARISON:  None. FINDINGS: Brain: There is a large round partially calcified mass within the RIGHT cerebellum measuring 3.6 by 3.8 by 3.5 cm. The mass has dense central calcification and wispy peripheral calcification. Mass is fairly well-circumscribed and abuts the dural surfaces. There is mass effect exerted by the calcified lesion with vasogenic edema within the RIGHT cerebellum and compression of the fourth ventricle. Additionally there is moderate to  severe hydrocephalus of the lateral ventricles and third ventricle. Vascular: No hyperdense vessel or unexpected calcification. Skull: No clear bony changes adjacent to the partially calcified RIGHT cerebellar mass. Sinuses/Orbits: Unremarkable Other: none IMPRESSION: 1. 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 Mass exerts mass effect with compression of the fourth ventricle with moderate to severe hydronephrosis of the lateral ventricles and third ventricle. Recommend neuro surgical consultation. 2. No clear bony changes adjacent to the RIGHT cerebral mass. Mass does abut the dural surface therefore meningioma is a consideration however other benign and malignant primary brain lesions need to be considered. Recommend MRI with and without contrast for further evaluation. Critical Value/emergent results were called by telephone at the time of interpretation on 12/17/2019 at 5:33 pm to provider [facility] PATEL , who verbally acknowledged these results. Electronically Signed: By: Suzy Bouchard M.D. On: 12/17/2019 17:34   DG Hip Unilat With Pelvis 2-3 Views Left  Result Date: 12/17/2019 CLINICAL DATA:  64 year old female with left hip pain. EXAM: DG HIP (WITH OR WITHOUT PELVIS) 2-3V LEFT COMPARISON:  None. FINDINGS: There is no acute fracture or dislocation. No significant arthritic changes. The soft tissues are unremarkable. IMPRESSION: Negative. Electronically Signed   By: Anner Crete M.D.   On: 12/17/2019 17:26    Assessment/Plan: Awaiting brain MRI.  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  Patient will need surgery.  Observe in ICU.  I have discussed situation with patient and her daughter.    LOS: 1 day    Peggyann Shoals, MD 12/18/2019, 9:45 AM

## 2019-12-19 NOTE — Progress Notes (Signed)
Neurosurgery Service Progress Note  Subjective: Assuming care from Dr. Vertell Limber given the imaging findings.    Objective: Vitals:   12/19/19 0400 12/19/19 0500 12/19/19 0600 12/19/19 0700  BP: 135/83 (!) 147/89 (!) 151/90 (!) 156/90  Pulse: 84 90 (!) 101 87  Resp: 15 (!) 21 19 20   Temp:      TempSrc:      SpO2: 97% 97% 97% 90%  Weight:      Height:       Temp (24hrs), Avg:98.4 F (36.9 C), Min:98.1 F (36.7 C), Max:98.7 F (37.1 C)  CBC Latest Ref Rng & Units 12/18/2019 12/17/2019  WBC 4.0 - 10.5 K/uL 13.2(H) 10.6(H)  Hemoglobin 12.0 - 15.0 g/dL 13.0 12.8  Hematocrit 36.0 - 46.0 % 39.7 38.7  Platelets 150 - 400 K/uL 297 279   BMP Latest Ref Rng & Units 12/18/2019 12/17/2019  Glucose 70 - 99 mg/dL 162(H) 168(H)  BUN 8 - 23 mg/dL 18 14  Creatinine 0.44 - 1.00 mg/dL 0.66 0.58  Sodium 135 - 145 mmol/L 141 141  Potassium 3.5 - 5.1 mmol/L 3.6 3.4(L)  Chloride 98 - 111 mmol/L 107 108  CO2 22 - 32 mmol/L 23 23  Calcium 8.9 - 10.3 mg/dL 9.2 8.8(L)    Intake/Output Summary (Last 24 hours) at 12/19/2019 0729 Last data filed at 12/18/2019 1900 Gross per 24 hour  Intake 480 ml  Output --  Net 480 ml    Current Facility-Administered Medications:  .  0.9 % NaCl with KCl 20 mEq/ L  infusion, , Intravenous, Continuous, Erline Levine, MD, Last Rate: 10 mL/hr at 12/18/19 0700, Rate Verify at 12/18/19 0700 .  acetaminophen (TYLENOL) tablet 650 mg, 650 mg, Oral, Q6H PRN **OR** acetaminophen (TYLENOL) suppository 650 mg, 650 mg, Rectal, Q6H PRN, Erline Levine, MD .  amLODipine (NORVASC) tablet 10 mg, 10 mg, Oral, Daily, Erline Levine, MD, 10 mg at 12/18/19 1005 .  atorvastatin (LIPITOR) tablet 40 mg, 40 mg, Oral, q1800, Erline Levine, MD, 40 mg at 12/18/19 1806 .  bisacodyl (DULCOLAX) suppository 10 mg, 10 mg, Rectal, Daily PRN, Erline Levine, MD .  carvedilol (COREG) tablet 3.125 mg, 3.125 mg, Oral, BID WC, Erline Levine, MD, 3.125 mg at 12/18/19 1806 .  Chlorhexidine Gluconate Cloth 2 % PADS  6 each, 6 each, Topical, Daily, Erline Levine, MD, 6 each at 12/18/19 1043 .  dexamethasone (DECADRON) injection 4 mg, 4 mg, Intravenous, Q6H, Erline Levine, MD, 4 mg at 12/19/19 [id number] .  docusate sodium (COLACE) capsule 100 mg, 100 mg, Oral, BID, Erline Levine, MD, 100 mg at 12/18/19 2147 .  HYDROcodone-acetaminophen (NORCO/VICODIN) 5-325 MG per tablet 1-2 tablet, 1-2 tablet, Oral, Q4H PRN, Erline Levine, MD, 2 tablet at 12/18/19 1806 .  HYDROmorphone (DILAUDID) injection 0.5-1 mg, 0.5-1 mg, Intravenous, Q2H PRN, Erline Levine, MD, 1 mg at 12/18/19 2147 .  multivitamin with minerals tablet 1 tablet, 1 tablet, Oral, Daily, Erline Levine, MD .  ondansetron [facility]) tablet 4 mg, 4 mg, Oral, Q6H PRN **OR** ondansetron (ZOFRAN) injection 4 mg, 4 mg, Intravenous, Q6H PRN, Erline Levine, MD .  pantoprazole (PROTONIX) EC tablet 40 mg, 40 mg, Oral, Daily, Erline Levine, MD, 40 mg at 12/18/19 1005 .  polyethylene glycol (MIRALAX / GLYCOLAX) packet 17 g, 17 g, Oral, Daily PRN, Erline Levine, MD .  sodium phosphate (FLEET) 7-19 GM/118ML enema 1 enema, 1 enema, Rectal, Once PRN, Erline Levine, MD  Assessment & Plan: 64 y.o. woman w/ h/o progressive headaches then worsening ataxia, CTH/MRI with  ventriculomegaly, large dural based mass adjacent to the R cerebellar hemisphere, extends from the tent / TS junction, along the length of the sigmoid to the jugular bulb, appears to involve the dorsal portion of the IAC w/ significant Ca2+, likely meningioma.  -discussed w/ pt's family, who translate for her, OR for resection tomorrow, NPO p MN  Belinda Berg  12/19/19 7:29 AM

## 2019-12-19 NOTE — Plan of Care (Signed)
  Problem: Clinical Measurements: Goal: Ability to maintain intracranial pressure will improve Outcome: Progressing   Problem: Clinical Measurements: Goal: Will remain free from infection Outcome: Progressing Goal: Diagnostic test results will improve Outcome: Progressing Goal: Respiratory complications will improve Outcome: Progressing Goal: Cardiovascular complication will be avoided Outcome: Progressing

## 2019-12-19 NOTE — Progress Notes (Signed)
Subjective: Patient was seen this morning in her room with her son and RN at bedside. The patient reports "doing better and my headache is much improved." RN reports mild truncal ataxia while ambulating. No acute events overnight.   Objective: Vital signs in last 24 hours: Temp:  [98.1 F (36.7 C)-98.8 F (37.1 C)] 98.8 F (37.1 C) (06/01 0800) Pulse Rate:  [30-117] 95 (06/01 0856) Resp:  [14-26] 20 (06/01 0700) BP: (131-177)/(64-103) 154/99 (06/01 0856) SpO2:  [90 %-97 %] 90 % (06/01 0700)  Intake/Output from previous day: 05/31 0701 - 06/01 0700 In: 480 [P.O.:480] Out: -  Intake/Output this shift: No intake/output data recorded.  Physical Exam: Patient is A/O X4, conversant and answering questions approprietly with the son acting as her translator. She reports a mild headache that has improved greatly since admission. Mild truncal ataxia. Strength is 5/5 and equal in her BUE and BLE. Sensation is intact.  Lab Results: Recent Labs    12/17/19 1702 12/18/19 0622  WBC 10.6* 13.2*  HGB 12.8 13.0  HCT 38.7 39.7  PLT 279 297   BMET Recent Labs    12/17/19 1702 12/18/19 0622  NA 141 141  K 3.4* 3.6  CL 108 107  CO2 23 23  GLUCOSE 168* 162*  BUN 14 18  CREATININE 0.58 0.66  CALCIUM 8.8* 9.2    Studies/Results: CT Head Wo Contrast  Addendum Date: 12/18/2019   ADDENDUM REPORT: 12/18/2019 08:26 ADDENDUM: Dictation correction: First sentence of IMPRESSION 1., "hydrocephalus" replaces "hydronephrosis". First sentence of the IMPRESSION 2., "cerebellar" replaces "cerebral". Electronically Signed   By: Suzy Bouchard M.D.   On: 12/18/2019 08:26   Result Date: 12/18/2019 CLINICAL DATA:  Headache. EXAM: CT HEAD WITHOUT CONTRAST TECHNIQUE: Contiguous axial images were obtained from the base of the skull through the vertex without intravenous contrast. COMPARISON:  None. FINDINGS: Brain: There is a large round partially calcified mass within the RIGHT cerebellum measuring 3.6 by  3.8 by 3.5 cm. The mass has dense central calcification and wispy peripheral calcification. Mass is fairly well-circumscribed and abuts the dural surfaces. There is mass effect exerted by the calcified lesion with vasogenic edema within the RIGHT cerebellum and compression of the fourth ventricle. Additionally there is moderate to severe hydrocephalus of the lateral ventricles and third ventricle. Vascular: No hyperdense vessel or unexpected calcification. Skull: No clear bony changes adjacent to the partially calcified RIGHT cerebellar mass. Sinuses/Orbits: Unremarkable Other: none IMPRESSION: 1. 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 Mass exerts mass effect with compression of the fourth ventricle with moderate to severe hydronephrosis of the lateral ventricles and third ventricle. Recommend neuro surgical consultation. 2. No clear bony changes adjacent to the RIGHT cerebral mass. Mass does abut the dural surface therefore meningioma is a consideration however other benign and malignant primary brain lesions need to be considered. Recommend MRI with and without contrast for further evaluation. Critical Value/emergent results were called by telephone at the time of interpretation on 12/17/2019 at 5:33 pm to provider [facility] PATEL , who verbally acknowledged these results. Electronically Signed: By: Suzy Bouchard M.D. On: 12/17/2019 17:34   MR BRAIN W WO CONTRAST  Result Date: 12/18/2019 CLINICAL DATA:  Cerebellar mass EXAM: MRI HEAD WITHOUT AND WITH CONTRAST TECHNIQUE: Multiplanar, multiecho pulse sequences of the brain and surrounding structures were obtained without and with intravenous contrast. CONTRAST:  85mL GADAVIST GADOBUTROL 1 MMOL/ML IV SOLN COMPARISON:  None. FINDINGS: Brain: There is an approximately 4.7 x 3.9 x 3.8 cm extra-axial mass  of the right posterior fossa. This demonstrates heterogeneous enhancement and significant susceptibility secondary to calcification, which was evident  on the prior CT. Mass abuts the undersurface of the tentorium. Edema is present in the underlying cerebellum. Mass and edema together cause significant mass effect including tonsillar herniation, ascending transtentorial herniation, distortion of the brainstem, and effacement of the inferior fourth ventricle with resulting hydrocephalus. T2 hyperintensity in the white matter about the lateral ventricles likely reflects hydrocephalus related interstitial edema. Resulting sulcal effacement and partial effacement of the basal cisterns. Small focus of susceptibility within the pons compatible with chronic microhemorrhage or less likely mineralization. There is no acute infarction. Vascular: Compression and possible invasion of the distal right transverse sinus. Preserved patency. Major arterial vessel flow voids at the skull base are preserved. Skull and upper cervical spine: Normal marrow signal is preserved. Sinuses/Orbits: Paranasal sinuses are aerated. Orbits are unremarkable. Other: Expanded, "empty" sella.  Mastoid air cells are clear. IMPRESSION: Large right posterior fossa extra-axial mass with calcification likely reflecting a meningioma. Compression and potential invasion of the distal right transverse sinus without occlusion. Associated tonsillar herniation, ascending transtentorial herniation, and distortion of the brainstem. Associated obstructive hydrocephalus with periventricular interstitial edema producing sulcal effacement and partial effacement of the basal cisterns. Electronically Signed   By: Macy Mis M.D.   On: 12/18/2019 13:00   DG Hip Unilat With Pelvis 2-3 Views Left  Result Date: 12/17/2019 CLINICAL DATA:  64 year old female with left hip pain. EXAM: DG HIP (WITH OR WITHOUT PELVIS) 2-3V LEFT COMPARISON:  None. FINDINGS: There is no acute fracture or dislocation. No significant arthritic changes. The soft tissues are unremarkable. IMPRESSION: Negative. Electronically Signed   By: Anner Crete M.D.   On: 12/17/2019 17:26    Assessment/Plan: Patient has large right cerebellar mass with obstructive hydrocephalus.  She feels better after decadron and has no deficts on exam.  Continue steroids with plan for sub-occipital craniectomy with tumor resection in the near future, per Dr. Zada Finders..      LOS: 2 days    Peggyann Shoals, MD 12/19/2019, 9:22 AM

## 2019-12-20 ENCOUNTER — Inpatient Hospital Stay (HOSPITAL_COMMUNITY): Payer: Self-pay | Admitting: Anesthesiology

## 2019-12-20 ENCOUNTER — Encounter (HOSPITAL_COMMUNITY)
Admission: EM | Disposition: A | Discharge status: Home / Self Care | Payer: Self-pay | Source: Home / Self Care | Attending: Neurological Surgery

## 2019-12-20 ENCOUNTER — Encounter (HOSPITAL_COMMUNITY): Payer: Self-pay | Admitting: Neurosurgery

## 2019-12-20 ENCOUNTER — Other Ambulatory Visit: Payer: Self-pay

## 2019-12-20 HISTORY — PX: APPLICATION OF CRANIAL NAVIGATION: SHX6578

## 2019-12-20 HISTORY — PX: CRANIOTOMY: SHX93

## 2019-12-20 LAB — POCT I-STAT 7, (LYTES, BLD GAS, ICA,H+H)
Acid-Base Excess: 1 mmol/L (ref 0.0–2.0)
Acid-base deficit: 4 mmol/L — ABNORMAL HIGH (ref 0.0–2.0)
Bicarbonate: 20 mmol/L (ref 20.0–28.0)
Bicarbonate: 24.7 mmol/L (ref 20.0–28.0)
Calcium, Ion: 1.01 mmol/L — ABNORMAL LOW (ref 1.15–1.40)
Calcium, Ion: 1.11 mmol/L — ABNORMAL LOW (ref 1.15–1.40)
HCT: 24 % — ABNORMAL LOW (ref 36.0–46.0)
HCT: 27 % — ABNORMAL LOW (ref 36.0–46.0)
Hemoglobin: 8.2 g/dL — ABNORMAL LOW (ref 12.0–15.0)
Hemoglobin: 9.2 g/dL — ABNORMAL LOW (ref 12.0–15.0)
O2 Saturation: 100 %
O2 Saturation: 100 %
Patient temperature: 36.4
Potassium: 3.5 mmol/L (ref 3.5–5.1)
Potassium: 4.1 mmol/L (ref 3.5–5.1)
Sodium: 145 mmol/L (ref 135–145)
Sodium: 144 mmol/L (ref 135–145)
TCO2: 21 mmol/L — ABNORMAL LOW (ref 22–32)
TCO2: 26 mmol/L (ref 22–32)
pCO2 arterial: 29.6 mmHg — ABNORMAL LOW (ref 32.0–48.0)
pCO2 arterial: 36 mmHg (ref 32.0–48.0)
pH, Arterial: 7.437 (ref 7.350–7.450)
pH, Arterial: 7.442 (ref 7.350–7.450)
pO2, Arterial: 211 mmHg — ABNORMAL HIGH (ref 83.0–108.0)
pO2, Arterial: 198 mmHg — ABNORMAL HIGH (ref 83.0–108.0)

## 2019-12-20 LAB — ABO/RH: ABO/RH(D): B POS

## 2019-12-20 LAB — PREPARE RBC (CROSSMATCH)

## 2019-12-20 SURGERY — CRANIOTOMY TUMOR EXCISION
Anesthesia: General | Site: Head | Laterality: Right

## 2019-12-20 MED ORDER — CHLORHEXIDINE GLUCONATE 0.12 % MT SOLN: 15.0000 mL | Freq: Once | OROMUCOSAL | Status: AC

## 2019-12-20 MED ORDER — SODIUM CHLORIDE 0.9 % IV SOLN
0.0125 ug/kg/min | INTRAVENOUS | Status: DC
  Filled 2019-12-20: qty 2000

## 2019-12-20 MED ORDER — SODIUM CHLORIDE 0.9 % IV SOLN
0.0125 ug/kg/min | INTRAVENOUS | Status: AC
  Administered 2019-12-20 (×2): .1 ug/kg/min via INTRAVENOUS
  Filled 2019-12-20: qty 2000

## 2019-12-20 MED ORDER — LIDOCAINE-EPINEPHRINE 1 %-1:100000 IJ SOLN
INTRAMUSCULAR | Status: AC
  Filled 2019-12-20: qty 1

## 2019-12-20 MED ORDER — FENTANYL CITRATE (PF) 100 MCG/2ML IJ SOLN: 25.0000 ug | INTRAMUSCULAR | Status: DC | PRN

## 2019-12-20 MED ORDER — PHENYLEPHRINE 40 MCG/ML (10ML) SYRINGE FOR IV PUSH (FOR BLOOD PRESSURE SUPPORT)
PREFILLED_SYRINGE | INTRAVENOUS | Status: AC
  Filled 2019-12-20: qty 20

## 2019-12-20 MED ORDER — ONDANSETRON HCL 4 MG/2ML IJ SOLN
INTRAMUSCULAR | Status: AC
  Filled 2019-12-20: qty 2

## 2019-12-20 MED ORDER — LIDOCAINE-EPINEPHRINE 1 %-1:100000 IJ SOLN
INTRAMUSCULAR | Status: DC | PRN
  Administered 2019-12-20: 5 mL

## 2019-12-20 MED ORDER — OXYCODONE HCL 5 MG PO TABS: 5.0000 mg | ORAL_TABLET | Freq: Once | ORAL | Status: DC | PRN

## 2019-12-20 MED ORDER — ONDANSETRON HCL 4 MG/2ML IJ SOLN
INTRAMUSCULAR | Status: DC | PRN
  Administered 2019-12-20: 4 mg via INTRAVENOUS

## 2019-12-20 MED ORDER — PROPOFOL 10 MG/ML IV BOLUS
INTRAVENOUS | Status: AC
  Filled 2019-12-20: qty 20

## 2019-12-20 MED ORDER — PROPOFOL 10 MG/ML IV BOLUS
INTRAVENOUS | Status: DC | PRN
  Administered 2019-12-20 (×2): 50 mg via INTRAVENOUS
  Administered 2019-12-20 (×2): 100 mg via INTRAVENOUS

## 2019-12-20 MED ORDER — THROMBIN 5000 UNITS EX SOLR
OROMUCOSAL | Status: DC | PRN
  Administered 2019-12-20: 5 mL via TOPICAL

## 2019-12-20 MED ORDER — ROCURONIUM BROMIDE 10 MG/ML (PF) SYRINGE
PREFILLED_SYRINGE | INTRAVENOUS | Status: AC
  Filled 2019-12-20: qty 10

## 2019-12-20 MED ORDER — FENTANYL CITRATE (PF) 250 MCG/5ML IJ SOLN
INTRAMUSCULAR | Status: AC
  Filled 2019-12-20: qty 5

## 2019-12-20 MED ORDER — BACITRACIN ZINC 500 UNIT/GM EX OINT
TOPICAL_OINTMENT | CUTANEOUS | Status: DC | PRN
  Administered 2019-12-20: 1 via TOPICAL

## 2019-12-20 MED ORDER — EPHEDRINE 5 MG/ML INJ
INTRAVENOUS | Status: AC
  Filled 2019-12-20: qty 10

## 2019-12-20 MED ORDER — MANNITOL 25 % IV SOLN
INTRAVENOUS | Status: DC | PRN
  Administered 2019-12-20: 37.5 g via INTRAVENOUS

## 2019-12-20 MED ORDER — SUCCINYLCHOLINE CHLORIDE 200 MG/10ML IV SOSY
PREFILLED_SYRINGE | INTRAVENOUS | Status: DC | PRN
  Administered 2019-12-20: 100 mg via INTRAVENOUS

## 2019-12-20 MED ORDER — CHLORHEXIDINE GLUCONATE 0.12 % MT SOLN
OROMUCOSAL | Status: AC
  Administered 2019-12-20: 15 mL via OROMUCOSAL
  Filled 2019-12-20: qty 15

## 2019-12-20 MED ORDER — SUCCINYLCHOLINE CHLORIDE 200 MG/10ML IV SOSY
PREFILLED_SYRINGE | INTRAVENOUS | Status: AC
  Filled 2019-12-20: qty 10

## 2019-12-20 MED ORDER — SODIUM CHLORIDE 0.9 % IV SOLN
INTRAVENOUS | Status: DC | PRN
Start: 2019-12-20 — End: 2019-12-20

## 2019-12-20 MED ORDER — PHENYLEPHRINE HCL-NACL 10-0.9 MG/250ML-% IV SOLN
INTRAVENOUS | Status: DC | PRN
  Administered 2019-12-20: 20 ug/min via INTRAVENOUS
  Administered 2019-12-20: 55 ug/min via INTRAVENOUS

## 2019-12-20 MED ORDER — OXYCODONE HCL 5 MG/5ML PO SOLN: 5.0000 mg | Freq: Once | ORAL | Status: DC | PRN

## 2019-12-20 MED ORDER — CEFAZOLIN SODIUM 1 G IJ SOLR
INTRAMUSCULAR | Status: AC
  Filled 2019-12-20: qty 20

## 2019-12-20 MED ORDER — ONDANSETRON HCL 4 MG/2ML IJ SOLN: 4.0000 mg | Freq: Four times a day (QID) | INTRAMUSCULAR | Status: DC | PRN

## 2019-12-20 MED ORDER — DEXAMETHASONE SODIUM PHOSPHATE 10 MG/ML IJ SOLN
INTRAMUSCULAR | Status: AC
  Filled 2019-12-20: qty 1

## 2019-12-20 MED ORDER — THROMBIN 5000 UNITS EX SOLR
CUTANEOUS | Status: AC
  Filled 2019-12-20: qty 5000

## 2019-12-20 MED ORDER — ALBUMIN HUMAN 5 % IV SOLN: INTRAVENOUS | Status: DC | PRN

## 2019-12-20 MED ORDER — EPHEDRINE SULFATE-NACL 50-0.9 MG/10ML-% IV SOSY
PREFILLED_SYRINGE | INTRAVENOUS | Status: DC | PRN
  Administered 2019-12-20: 5 mg via INTRAVENOUS

## 2019-12-20 MED ORDER — LIDOCAINE 2% (20 MG/ML) 5 ML SYRINGE
INTRAMUSCULAR | Status: DC | PRN
  Administered 2019-12-20: 40 mg via INTRAVENOUS

## 2019-12-20 MED ORDER — DEXAMETHASONE SODIUM PHOSPHATE 10 MG/ML IJ SOLN
INTRAMUSCULAR | Status: DC | PRN
  Administered 2019-12-20: 10 mg via INTRAVENOUS

## 2019-12-20 MED ORDER — LIDOCAINE 2% (20 MG/ML) 5 ML SYRINGE
INTRAMUSCULAR | Status: AC
  Filled 2019-12-20: qty 5

## 2019-12-20 MED ORDER — ORAL CARE MOUTH RINSE: 15.0000 mL | Freq: Once | OROMUCOSAL | Status: AC

## 2019-12-20 MED ORDER — BACITRACIN ZINC 500 UNIT/GM EX OINT
TOPICAL_OINTMENT | CUTANEOUS | Status: AC
  Filled 2019-12-20: qty 28.35

## 2019-12-20 MED ORDER — FENTANYL CITRATE (PF) 250 MCG/5ML IJ SOLN
INTRAMUSCULAR | Status: DC | PRN
  Administered 2019-12-20 (×3): 50 ug via INTRAVENOUS
  Administered 2019-12-20 (×2): 100 ug via INTRAVENOUS

## 2019-12-20 MED ORDER — LACTATED RINGERS IV SOLN: INTRAVENOUS | Status: DC

## 2019-12-20 MED ORDER — CEFAZOLIN SODIUM-DEXTROSE 2-3 GM-%(50ML) IV SOLR
INTRAVENOUS | Status: DC | PRN
Start: 2019-12-20 — End: 2019-12-20
  Administered 2019-12-20 (×2): 2 g via INTRAVENOUS

## 2019-12-20 MED ORDER — HEMOSTATIC AGENTS (NO CHARGE) OPTIME
TOPICAL | Status: DC | PRN
  Administered 2019-12-20: 1 via TOPICAL

## 2019-12-20 MED ORDER — THROMBIN 20000 UNITS EX SOLR
CUTANEOUS | Status: AC
  Filled 2019-12-20: qty 20000

## 2019-12-20 MED ORDER — SODIUM CHLORIDE 0.9% IV SOLUTION: Freq: Once | INTRAVENOUS | Status: DC

## 2019-12-20 MED ORDER — 0.9 % SODIUM CHLORIDE (POUR BTL) OPTIME
TOPICAL | Status: DC | PRN
  Administered 2019-12-20: 3000 mL

## 2019-12-20 MED ORDER — THROMBIN 20000 UNITS EX SOLR
CUTANEOUS | Status: DC | PRN
  Administered 2019-12-20: 20 mL via TOPICAL

## 2019-12-20 SURGICAL SUPPLY — 105 items
BAND RUBBER #18 3X1/16 STRL (MISCELLANEOUS) IMPLANT
BENZOIN TINCTURE PRP APPL 2/3 (GAUZE/BANDAGES/DRESSINGS) IMPLANT
BLADE CLIPPER SURG (BLADE) ×4 IMPLANT
BLADE SAW GIGLI 16 STRL (MISCELLANEOUS) IMPLANT
BLADE SURG 15 STRL LF DISP TIS (BLADE) IMPLANT
BLADE SURG 15 STRL SS (BLADE)
BNDG GAUZE ELAST 4 BULKY (GAUZE/BANDAGES/DRESSINGS) IMPLANT
BNDG STRETCH 4X75 STRL LF (GAUZE/BANDAGES/DRESSINGS) IMPLANT
BUR ACORN 9.0 PRECISION (BURR) ×3 IMPLANT
BUR ACORN 9.0MM PRECISION (BURR) ×1
BUR ROUND FLUTED 4 SOFT TCH (BURR) IMPLANT
BUR ROUND FLUTED 4MM SOFT TCH (BURR)
BUR SPIRAL ROUTER 2.3 (BUR) ×3 IMPLANT
BUR SPIRAL ROUTER 2.3MM (BUR) ×1
CANISTER SUCT 3000ML PPV (MISCELLANEOUS) ×8 IMPLANT
CATH VENTRIC 35X38 W/TROCAR LG (CATHETERS) IMPLANT
CLIP VESOCCLUDE MED 6/CT (CLIP) IMPLANT
CNTNR URN SCR LID CUP LEK RST (MISCELLANEOUS) ×2 IMPLANT
CONT SPEC 4OZ STRL OR WHT (MISCELLANEOUS) ×2
COTTONBALL LRG STERILE PKG (GAUZE/BANDAGES/DRESSINGS) ×6 IMPLANT
COVER BURR HOLE 20 W/TAB UNI (Plate) ×2 IMPLANT
COVER MAYO STAND STRL (DRAPES) IMPLANT
COVER WAND RF STERILE (DRAPES) ×4 IMPLANT
DECANTER SPIKE VIAL GLASS SM (MISCELLANEOUS) ×4 IMPLANT
DRAIN SUBARACHNOID (WOUND CARE) IMPLANT
DRAPE HALF SHEET 40X57 (DRAPES) ×4 IMPLANT
DRAPE MICROSCOPE LEICA (MISCELLANEOUS) IMPLANT
DRAPE NEUROLOGICAL W/INCISE (DRAPES) ×4 IMPLANT
DRAPE STERI IOBAN 125X83 (DRAPES) IMPLANT
DRAPE SURG 17X23 STRL (DRAPES) IMPLANT
DRAPE WARM FLUID 44X44 (DRAPES) ×4 IMPLANT
DRSG ADAPTIC 3X8 NADH LF (GAUZE/BANDAGES/DRESSINGS) IMPLANT
DRSG TELFA 3X8 NADH (GAUZE/BANDAGES/DRESSINGS) IMPLANT
DURAPREP 6ML APPLICATOR 50/CS (WOUND CARE) ×4 IMPLANT
ELECT REM PT RETURN 9FT ADLT (ELECTROSURGICAL) ×4
ELECTRODE REM PT RTRN 9FT ADLT (ELECTROSURGICAL) ×2 IMPLANT
EVACUATOR 1/8 PVC DRAIN (DRAIN) IMPLANT
EVACUATOR SILICONE 100CC (DRAIN) IMPLANT
FEE INTRAOP MONITOR IMPULS NCS (MISCELLANEOUS) IMPLANT
FORCEPS BIPOLAR SPETZLER 8 1.0 (NEUROSURGERY SUPPLIES) ×6 IMPLANT
GAUZE 4X4 16PLY RFD (DISPOSABLE) IMPLANT
GAUZE SPONGE 4X4 12PLY STRL (GAUZE/BANDAGES/DRESSINGS) IMPLANT
GLOVE BIO SURGEON STRL SZ7 (GLOVE) ×2 IMPLANT
GLOVE BIO SURGEON STRL SZ7.5 (GLOVE) ×4 IMPLANT
GLOVE BIOGEL PI IND STRL 7.0 (GLOVE) IMPLANT
GLOVE BIOGEL PI IND STRL 7.5 (GLOVE) ×2 IMPLANT
GLOVE BIOGEL PI INDICATOR 7.0 (GLOVE)
GLOVE BIOGEL PI INDICATOR 7.5 (GLOVE) ×2
GLOVE ECLIPSE 7.0 STRL STRAW (GLOVE) ×2 IMPLANT
GLOVE EXAM NITRILE LRG STRL (GLOVE) IMPLANT
GLOVE EXAM NITRILE XL STR (GLOVE) IMPLANT
GLOVE EXAM NITRILE XS STR PU (GLOVE) IMPLANT
GLOVE INDICATOR 7.5 STRL GRN (GLOVE) ×2 IMPLANT
GOWN STRL REUS W/ TWL LRG LVL3 (GOWN DISPOSABLE) ×4 IMPLANT
GOWN STRL REUS W/ TWL XL LVL3 (GOWN DISPOSABLE) IMPLANT
GOWN STRL REUS W/TWL 2XL LVL3 (GOWN DISPOSABLE) IMPLANT
GOWN STRL REUS W/TWL LRG LVL3 (GOWN DISPOSABLE) ×6
GOWN STRL REUS W/TWL XL LVL3 (GOWN DISPOSABLE)
GRAFT DURAGEN MATRIX 2WX2L ×2 IMPLANT
HEMOSTAT POWDER KIT SURGIFOAM (HEMOSTASIS) ×4 IMPLANT
HEMOSTAT SURGICEL 2X14 (HEMOSTASIS) ×4 IMPLANT
HOOK DURA 1/2IN (MISCELLANEOUS) ×4 IMPLANT
INTRAOP MONITOR FEE IMPULS NCS (MISCELLANEOUS) ×2
INTRAOP MONITOR FEE IMPULSE (MISCELLANEOUS) ×2
IV NS 1000ML (IV SOLUTION) ×2
IV NS 1000ML BAXH (IV SOLUTION) ×2 IMPLANT
KIT BASIN OR (CUSTOM PROCEDURE TRAY) ×4 IMPLANT
KIT DRAIN CSF ACCUDRAIN (MISCELLANEOUS) IMPLANT
KIT TURNOVER KIT B (KITS) ×4 IMPLANT
MARKER SPHERE PSV REFLC 13MM (MARKER) ×12 IMPLANT
NDL SPNL 18GX3.5 QUINCKE PK (NEEDLE) IMPLANT
NEEDLE HYPO 22GX1.5 SAFETY (NEEDLE) ×4 IMPLANT
NEEDLE SPNL 18GX3.5 QUINCKE PK (NEEDLE) IMPLANT
NS IRRIG 1000ML POUR BTL (IV SOLUTION) ×12 IMPLANT
PACK BATTERY CMF DISP FOR DVR (ORTHOPEDIC DISPOSABLE SUPPLIES) ×2 IMPLANT
PACK CRANIOTOMY CUSTOM (CUSTOM PROCEDURE TRAY) ×4 IMPLANT
PAD DRESSING TELFA 3X8 NADH (GAUZE/BANDAGES/DRESSINGS) IMPLANT
PATTIES SURGICAL .25X.25 (GAUZE/BANDAGES/DRESSINGS) IMPLANT
PATTIES SURGICAL .5 X.5 (GAUZE/BANDAGES/DRESSINGS) ×2 IMPLANT
PATTIES SURGICAL .5 X3 (DISPOSABLE) IMPLANT
PATTIES SURGICAL 1/4 X 3 (GAUZE/BANDAGES/DRESSINGS) IMPLANT
PATTIES SURGICAL 1X1 (DISPOSABLE) IMPLANT
PIN MAYFIELD SKULL DISP (PIN) ×4 IMPLANT
PROBE PRASS NERVE STD .05 TIP (MISCELLANEOUS) ×2 IMPLANT
SCREW UNIII AXS SD 1.5X4 (Screw) ×8 IMPLANT
SET CARTRIDGE AND TUBING (SET/KITS/TRAYS/PACK) ×2 IMPLANT
SPECIMEN JAR SMALL (MISCELLANEOUS) IMPLANT
SPONGE NEURO XRAY DETECT 1X3 (DISPOSABLE) IMPLANT
SPONGE SURGIFOAM ABS GEL 100 (HEMOSTASIS) ×4 IMPLANT
STAPLER VISISTAT 35W (STAPLE) ×4 IMPLANT
SUT ETHILON 3 0 FSL (SUTURE) IMPLANT
SUT ETHILON 3 0 PS 1 (SUTURE) IMPLANT
SUT MNCRL AB 3-0 PS2 18 (SUTURE) IMPLANT
SUT NURALON 4 0 TR CR/8 (SUTURE) ×12 IMPLANT
SUT SILK 0 TIES 10X30 (SUTURE) IMPLANT
SUT VIC AB 2-0 CP2 18 (SUTURE) ×6 IMPLANT
TIP SHEAR CVD EXTENDED 36KH (INSTRUMENTS) ×2 IMPLANT
TOWEL GREEN STERILE (TOWEL DISPOSABLE) ×4 IMPLANT
TOWEL GREEN STERILE FF (TOWEL DISPOSABLE) ×4 IMPLANT
TRAY FOLEY MTR SLVR 16FR STAT (SET/KITS/TRAYS/PACK) ×4 IMPLANT
TUBE CONNECTING 12'X1/4 (SUCTIONS) ×1
TUBE CONNECTING 12X1/4 (SUCTIONS) ×3 IMPLANT
UNDERPAD 30X36 HEAVY ABSORB (UNDERPADS AND DIAPERS) ×4 IMPLANT
WATER STERILE IRR 1000ML POUR (IV SOLUTION) ×4 IMPLANT
WRENCH TORQUE 36KHZ (INSTRUMENTS) ×2 IMPLANT

## 2019-12-20 NOTE — Transfer of Care (Signed)
Immediate Anesthesia Transfer of Care Note  Patient: Belinda Berg  Procedure(s) Performed: CRANIOTOMY TUMOR RESECTION (Right Head) APPLICATION OF CRANIAL NAVIGATION (N/A )  Patient Location: PACU  Anesthesia Type:General  Level of Consciousness: awake and patient cooperative  Airway & Oxygen Therapy: Patient connected to face mask oxygen  Post-op Assessment: Report given to RN and Post -op Vital signs reviewed and stable  Post vital signs: Reviewed and stable  Last Vitals:  Vitals Value Taken Time  BP    Temp    Pulse 96 12/20/19 2116  Resp 14 12/20/19 2118  SpO2 99 % 12/20/19 2117  Vitals shown include unvalidated device data.  Last Pain:  Vitals:   12/20/19 1200  TempSrc: Oral  PainSc: 5          Complications: No apparent anesthesia complications

## 2019-12-20 NOTE — Progress Notes (Signed)
Neurosurgery Service Progress Note  Subjective: NAE ON, son at bedside this morning and assisted with translating  Objective: Vitals:   12/20/19 0500 12/20/19 0600 12/20/19 0700 12/20/19 0800  BP: (!) 149/85 (!) 149/87 (!) 141/90   Pulse: 71 69 65   Resp: 16 16 16    Temp:    98.1 F (36.7 C)  TempSrc:    Oral  SpO2: 95% 96% 95%   Weight:      Height:       Temp (24hrs), Avg:98.2 F (36.8 C), Min:97.8 F (36.6 C), Max:98.8 F (37.1 C)  CBC Latest Ref Rng & Units 12/18/2019 12/17/2019  WBC 4.0 - 10.5 K/uL 13.2(H) 10.6(H)  Hemoglobin 12.0 - 15.0 g/dL 13.0 12.8  Hematocrit 36.0 - 46.0 % 39.7 38.7  Platelets 150 - 400 K/uL 297 279   BMP Latest Ref Rng & Units 12/18/2019 12/17/2019  Glucose 70 - 99 mg/dL 162(H) 168(H)  BUN 8 - 23 mg/dL 18 14  Creatinine 0.44 - 1.00 mg/dL 0.66 0.58  Sodium 135 - 145 mmol/L 141 141  Potassium 3.5 - 5.1 mmol/L 3.6 3.4(L)  Chloride 98 - 111 mmol/L 107 108  CO2 22 - 32 mmol/L 23 23  Calcium 8.9 - 10.3 mg/dL 9.2 8.8(L)    Intake/Output Summary (Last 24 hours) at 12/20/2019 [id number] Last data filed at 12/19/2019 1200 Gross per 24 hour  Intake 325 ml  Output --  Net 325 ml    Current Facility-Administered Medications:  .  acetaminophen (TYLENOL) tablet 650 mg, 650 mg, Oral, Q6H PRN, 650 mg at 12/19/19 1809 **OR** acetaminophen (TYLENOL) suppository 650 mg, 650 mg, Rectal, Q6H PRN, Erline Levine, MD .  amLODipine (NORVASC) tablet 10 mg, 10 mg, Oral, Daily, Erline Levine, MD, 10 mg at 12/19/19 0955 .  atorvastatin (LIPITOR) tablet 40 mg, 40 mg, Oral, q1800, Erline Levine, MD, 40 mg at 12/19/19 1803 .  bisacodyl (DULCOLAX) suppository 10 mg, 10 mg, Rectal, Daily PRN, Erline Levine, MD .  carvedilol (COREG) tablet 3.125 mg, 3.125 mg, Oral, BID WC, Erline Levine, MD, 3.125 mg at 12/20/19 0753 .  Chlorhexidine Gluconate Cloth 2 % PADS 6 each, 6 each, Topical, Daily, Erline Levine, MD, 6 each at 12/19/19 1005 .  dexamethasone (DECADRON) injection 4 mg, 4 mg,  Intravenous, Q6H, Erline Levine, MD, 4 mg at 12/20/19 [id number] .  docusate sodium (COLACE) capsule 100 mg, 100 mg, Oral, BID, Erline Levine, MD, 100 mg at 12/19/19 2300 .  HYDROcodone-acetaminophen (NORCO/VICODIN) 5-325 MG per tablet 1-2 tablet, 1-2 tablet, Oral, Q4H PRN, Erline Levine, MD, 2 tablet at 12/20/19 0347 .  HYDROmorphone (DILAUDID) injection 0.5-1 mg, 0.5-1 mg, Intravenous, Q2H PRN, Erline Levine, MD, 1 mg at 12/18/19 2147 .  multivitamin with minerals tablet 1 tablet, 1 tablet, Oral, Daily, Erline Levine, MD, 1 tablet at 12/19/19 0954 .  ondansetron (ZOFRAN) tablet 4 mg, 4 mg, Oral, Q6H PRN **OR** ondansetron (ZOFRAN) injection 4 mg, 4 mg, Intravenous, Q6H PRN, Erline Levine, MD .  pantoprazole (PROTONIX) EC tablet 40 mg, 40 mg, Oral, Daily, Erline Levine, MD, 40 mg at 12/19/19 0955 .  polyethylene glycol (MIRALAX / GLYCOLAX) packet 17 g, 17 g, Oral, Daily PRN, Erline Levine, MD .  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 woman w/ h/o  progressive headaches then worsening ataxia, CTH/MRI with ventriculomegaly, large dural based mass adjacent to the R cerebellar hemisphere, extends from the tent / TS junction, along the length of the sigmoid to the jugular bulb, appears to involve the dorsal portion of the IAC w/ significant Ca2+, likely meningioma.  -OR today for resection  Judith Part  12/20/19 9:05 AM

## 2019-12-20 NOTE — Op Note (Signed)
PATIENT: Belinda Berg  DAY OF SURGERY: 12/20/19   PRE-OPERATIVE DIAGNOSIS:  Brain tumor   POST-OPERATIVE DIAGNOSIS:  Brain tumor   PROCEDURE:  Right retrosigmoid craniotomy for tumor resection   SURGEON:  Surgeon(s) and Role:    Judith Part, MD - Primary   ANESTHESIA: ETGA   BRIEF HISTORY: This is a 64 year old woman who presented with signs and symptoms of obstructive hydrocephalus. The patient was found to have a large calcified dural-based mass in the right side of her posterior fossa. This was discussed with the patient as well as risks, benefits, and alternatives and wished to proceed with surgery.   OPERATIVE DETAIL: The patient was taken to the operating room and placed on the OR table in the supine position. A formal time out was performed with two patient identifiers and confirmed the operative site. Anesthesia was induced by the anesthesia team. The [place] head holder was applied to the head and a registration array was attached to the [place]. This was co-registered with the patient's preoperative imaging, the fit appeared to be acceptable. Using frameless stereotaxy, the operative trajectory was planned and the incision was marked. Hair was clipped with surgical clippers over the incision and the area was then prepped and draped in a sterile fashion.  A standard retrosigmoid incision was placed two finger breadths behind the right ear. Soft tissues were dissected and retracted and a craniectomy was performed with a high speed drill. Given the extensive involvement of the venous sinus, the sinus was exposed to allow for better visualization and vascular control. The dura was opened and retracted with sutures, the tumor was immediately apparent.   The dorsal portion was dissected free then internally debulked with the CUSA. Stereotaxy was used during the approach to help identify anatomy as well as help determine depth inside the tumor capsule during debulking. After  debulking, the tumor capsule was dissected off of the cerebellum medially. This was continued ventrally, at which point the facial nerve stimulator was used to evaluate for proximity to the facial nerve on the ventral portion of the capsule. The posterior aspect of the capsule was free, but I could not stimulate through the capsule to locate the nerve without violating the posterior capsule. I therefore left a remnant in the cistern along the cranial nerves to avoid the potential for a deficit. Tumor was dissected as well as possible off the tentorium and dura of the petrous face, but as expected could not be completely resected. The dura was coagulated for hemostasis as well as to hopefully reduce any viable tumor remaining.  The tumor was sent to pathology. Hemostasis was confirmed and the wound was copiously irrigated. Duragen was used since the dura was resected. The mastoid air cells were not violated, but the bone edge was waxed off laterally in case there was an occult violation. A titanium plate was placed over the craniectomy defect and secured with titanium screws.   All instrument and sponge counts were correct, the incision was then closed in layers. The patient was then returned to anesthesia for emergence. No apparent complications at the completion of the procedure.   EBL:  158mL   DRAINS: none   SPECIMENS: Right cerebellar brain tumor   Judith Part, MD 12/20/19 2:10 PM

## 2019-12-20 NOTE — Anesthesia Procedure Notes (Signed)
Procedure Name: Intubation Date/Time: 12/20/2019 3:18 PM Performed by: Trinna Post., CRNA Pre-anesthesia Checklist: Patient identified, Emergency Drugs available, Suction available, Patient being monitored and Timeout performed Patient Re-evaluated:Patient Re-evaluated prior to induction Oxygen Delivery Method: Circle system utilized Preoxygenation: Pre-oxygenation with 100% oxygen Induction Type: IV induction and Rapid sequence Laryngoscope Size: Mac and 3 Grade View: Grade II Tube type: Oral Tube size: 7.0 mm Number of attempts: 1 Airway Equipment and Method: Stylet Placement Confirmation: ETT inserted through vocal cords under direct vision,  positive ETCO2 and breath sounds checked- equal and bilateral Secured at: 22 cm Tube secured with: Tape Dental Injury: Teeth and Oropharynx as per pre-operative assessment

## 2019-12-20 NOTE — Brief Op Note (Signed)
12/20/2019  9:01 PM  PATIENT:  Manfred Arch  65 y.o. female  PRE-OPERATIVE DIAGNOSIS:  Brain Tumor  POST-OPERATIVE DIAGNOSIS:  Brain Tumor  PROCEDURE:  Procedure(s): CRANIOTOMY TUMOR RESECTION (Right) APPLICATION OF CRANIAL NAVIGATION (N/A)  SURGEON:  Surgeon(s) and Role:    * Judith Part, MD - Primary  PHYSICIAN ASSISTANT:   ASSISTANTS: none   ANESTHESIA:   general  EBL:  1550 mL   BLOOD ADMINISTERED:none  DRAINS: none   LOCAL MEDICATIONS USED:  LIDOCAINE   SPECIMEN:  Source of Specimen:  Cerebellar brain tumor  DISPOSITION OF SPECIMEN:  PATHOLOGY  COUNTS:  YES  TOURNIQUET:  * No tourniquets in log *  DICTATION: .Note written in EPIC  PLAN OF CARE: Admit to inpatient   PATIENT DISPOSITION:  PACU - hemodynamically stable.   Delay start of Pharmacological VTE agent (>24hrs) due to surgical blood loss or risk of bleeding: yes

## 2019-12-20 NOTE — Anesthesia Procedure Notes (Signed)
Arterial Line Insertion Start/End6/08/2019 2:30 PM Performed by: Mariea Clonts, CRNA, CRNA  Patient location: Pre-op. Preanesthetic checklist: patient identified, IV checked, site marked, risks and benefits discussed, surgical consent, monitors and equipment checked, pre-op evaluation, timeout performed and anesthesia consent Lidocaine 1% used for infiltration Left, radial was placed Catheter size: 20 G Hand hygiene performed  and maximum sterile barriers used   Attempts: 1 Procedure performed without using ultrasound guided technique. Following insertion, dressing applied and Biopatch. Post procedure assessment: normal  Patient tolerated the procedure well with no immediate complications.

## 2019-12-20 NOTE — Anesthesia Preprocedure Evaluation (Addendum)
Anesthesia Evaluation  Patient identified by MRN, date of birth, ID band Patient awake    Reviewed: Allergy & Precautions, H&P , NPO status , Patient's Chart, lab work & pertinent test results  Airway Mallampati: II   Neck ROM: full    Dental   Pulmonary neg pulmonary ROS,    breath sounds clear to auscultation       Cardiovascular hypertension, Pt. on medications and Pt. on home beta blockers negative cardio ROS   Rhythm:regular Rate:Normal     Neuro/Psych  Headaches, h/o progressive headaches then worsening ataxia, CTH/MRI with ventriculomegaly, large dural based mass adjacent to the R cerebellar hemisphere, extends from the tent / TS junction, along the length of the sigmoid to the jugular bulb, appears to involve the dorsal portion of the IAC w/ significant Ca2+, likely meningioma.  Decadron 4mg  q6  negative psych ROS   GI/Hepatic Neg liver ROS, GERD  Medicated and Controlled,  Endo/Other  negative endocrine ROS  Renal/GU negative Renal ROS  negative genitourinary   Musculoskeletal negative musculoskeletal ROS (+)   Abdominal   Peds  Hematology negative hematology ROS (+) hct 39.7   Anesthesia Other Findings   Reproductive/Obstetrics negative OB ROS                            Anesthesia Physical Anesthesia Plan  ASA: III  Anesthesia Plan: General   Post-op Pain Management:    Induction: Intravenous  PONV Risk Score and Plan: 3 and Ondansetron, Dexamethasone and Treatment may vary due to age or medical condition  Airway Management Planned: Oral ETT  Additional Equipment: Arterial line  Intra-op Plan:   Post-operative Plan: Extubation in OR and Possible Post-op intubation/ventilation  Informed Consent: I have reviewed the patients History and Physical, chart, labs and discussed the procedure including the risks, benefits and alternatives for the proposed anesthesia with the  patient or authorized representative who has indicated his/her understanding and acceptance.       Plan Discussed with: CRNA, Anesthesiologist and Surgeon  Anesthesia Plan Comments:        Anesthesia Quick Evaluation

## 2019-12-21 NOTE — Evaluation (Signed)
Physical Therapy Evaluation Patient Details Name: Belinda Berg MRN: [id number] DOB: [birth year] Today's Date: 12/21/2019   History of Present Illness  64 year old female who presented to [facility] emergency department for a 10/10 headache. A CT head was performed at [facility] and showed a large calcified mass in the right cerebellar hemisphere with compression of the 4th ventricle and obstructive hydrocephalus. Pt underwent craniotomy and resection on 6/2.  Clinical Impression  Pt presents to PT with deficits in functional mobility, gait, balance, endurance, and device management. Pt with slowed gait speed, taking cautious steps initially. Pt requires cues for device management to maintain RW closer to base of support. Pt also with some increase in balance deviations during turns. Pt will continue to benefit from aggressive mobilization and acute PT POC to improve balance and gait quality. PT currently recommends discharge home with home health PT, a RW, and assistance from family.    Follow Up Recommendations Home health PT;Supervision for mobility/OOB    Equipment Recommendations  Rolling walker with 5" wheels    Recommendations for Other Services       Precautions / Restrictions Precautions Precautions: Fall Restrictions Weight Bearing Restrictions: No      Mobility  Bed Mobility Overal bed mobility: Needs Assistance Bed Mobility: Supine to Sit;Sit to Supine     Supine to sit: Supervision Sit to supine: Supervision      Transfers Overall transfer level: Needs assistance Equipment used: Rolling walker (2 wheeled) Transfers: Sit to/from Stand Sit to Stand: Min guard            Ambulation/Gait Ambulation/Gait assistance: Min Gaffer (Feet): 150 Feet(additional trials of 10' x2 to bathroom) Assistive device: Rolling walker (2 wheeled) Gait Pattern/deviations: Step-to pattern;Step-through pattern Gait velocity: reduced Gait  velocity interpretation: <1.8 ft/sec, indicate of risk for recurrent falls General Gait Details: step to gait progressing to step through, initially minG but progressing to supervision with increased ambulation distances. PT provides cues to maintain RW close to BOS  Stairs            Wheelchair Mobility    Modified Rankin (Stroke Patients Only)       Balance Overall balance assessment: Needs assistance Sitting-balance support: No upper extremity supported;Feet supported Sitting balance-Leahy Scale: Good Sitting balance - Comments: supervision   Standing balance support: Single extremity supported;During functional activity Standing balance-Leahy Scale: Good Standing balance comment: close supervision when washing hands, unilateral UE support of RW                             Pertinent Vitals/Pain Pain Assessment: No/denies pain    Home Living Family/patient expects to be discharged to:: Private residence Living Arrangements: Children(daughter) Available Help at Discharge: Family;Available 24 hours/day(daughter works from home) Type of Home: Apartment Home Access: Stairs to enter Entrance Stairs-Rails: Psychiatric nurse of Steps: flight Home Layout: One level Home Equipment: None      Prior Function Level of Independence: Independent         Comments: was helping take care of 3 grandchildren all under 65 years old     Hand Dominance        Extremity/Trunk Assessment   Upper Extremity Assessment Upper Extremity Assessment: Overall WFL for tasks assessed    Lower Extremity Assessment Lower Extremity Assessment: Overall WFL for tasks assessed    Cervical / Trunk Assessment Cervical / Trunk Assessment: Normal  Communication   Communication: Prefers language other than  English(son present and interpreting)  Cognition Arousal/Alertness: Awake/alert Behavior During Therapy: Flat affect Overall Cognitive Status: Within  Functional Limits for tasks assessed                                        General Comments General comments (skin integrity, edema, etc.): VSS on RA    Exercises     Assessment/Plan    PT Assessment Patient needs continued PT services  PT Problem List Decreased activity tolerance;Decreased balance;Decreased mobility;Decreased knowledge of use of DME;Decreased knowledge of precautions       PT Treatment Interventions DME instruction;Gait training;Stair training;Functional mobility training;Therapeutic activities;Therapeutic exercise;Balance training;Neuromuscular re-education;Patient/family education    PT Goals (Current goals can be found in the Care Plan section)  Acute Rehab PT Goals Patient Stated Goal: To return to independent mobility PT Goal Formulation: With patient Time For Goal Achievement: 01/04/20 Potential to Achieve Goals: Good Additional Goals Additional Goal #1: Pt will maintain dynamic standing balance within 10 inches of her base of support without UE support with supervision    Frequency Min 4X/week   Barriers to discharge        Co-evaluation               AM-PAC PT "6 Clicks" Mobility  Outcome Measure Help needed turning from your back to your side while in a flat bed without using bedrails?: None Help needed moving from lying on your back to sitting on the side of a flat bed without using bedrails?: None Help needed moving to and from a bed to a chair (including a wheelchair)?: A Little Help needed standing up from a chair using your arms (e.g., wheelchair or bedside chair)?: A Little Help needed to walk in hospital room?: A Little Help needed climbing 3-5 steps with a railing? : A Lot 6 Click Score: 19    End of Session   Activity Tolerance: Patient tolerated treatment well Patient left: in bed;with call bell/phone within reach;with bed alarm set Nurse Communication: Mobility status PT Visit Diagnosis: Other  abnormalities of gait and mobility (R26.89);Unsteadiness on feet (R26.81)    Time: [id number] PT Time Calculation (min) (ACUTE ONLY): 26 min   Charges:   PT Evaluation $PT Eval Moderate Complexity: 1 Mod PT Treatments $Gait Training: 8-22 mins        Zenaida Niece, PT, DPT Acute Rehabilitation Pager: [phone]   Zenaida Niece 12/21/2019, 11:01 AM

## 2019-12-21 NOTE — Progress Notes (Signed)
Pt transferred from ICU, ambulated into room with walker. She is alert and oriented x4. Son Ismial at bedside to translate. Right cervical incision to head intact with skin glue. VS taken, pt stable. Per ICU RN, foley was removed at 630 this morning, pt voided prior to arrival on unit therefore bladder scan performed and revealed 276ml, pt denies having to use the bathroom again. Will monitor urinary output.

## 2019-12-21 NOTE — Progress Notes (Signed)
Neurosurgery Service Progress Note  Subjective: NAE ON, recovering well  Objective: Vitals:   12/21/19 0300 12/21/19 0400 12/21/19 0500 12/21/19 0600  BP: (!) 143/80 (!) 149/92 (!) 146/86 (!) 150/83  Pulse: 66 63 66 88  Resp: 16 15 15 20   Temp:  98.1 F (36.7 C)    TempSrc:  Oral    SpO2: 100% 100% 100% 100%  Weight:      Height:       Temp (24hrs), Avg:98.1 F (36.7 C), Min:97.5 F (36.4 C), Max:98.7 F (37.1 C)  CBC Latest Ref Rng & Units 12/20/2019 12/20/2019 12/18/2019  WBC 4.0 - 10.5 K/uL - - 13.2(H)  Hemoglobin 12.0 - 15.0 g/dL 9.2(L) 8.2(L) 13.0  Hematocrit 36.0 - 46.0 % 27.0(L) 24.0(L) 39.7  Platelets 150 - 400 K/uL - - 297   BMP Latest Ref Rng & Units 12/20/2019 12/20/2019 12/18/2019  Glucose 70 - 99 mg/dL - - 162(H)  BUN 8 - 23 mg/dL - - 18  Creatinine 0.44 - 1.00 mg/dL - - 0.66  Sodium 135 - 145 mmol/L 144 145 141  Potassium 3.5 - 5.1 mmol/L 4.1 3.5 3.6  Chloride 98 - 111 mmol/L - - 107  CO2 22 - 32 mmol/L - - 23  Calcium 8.9 - 10.3 mg/dL - - 9.2    Intake/Output Summary (Last 24 hours) at 12/21/2019 [id number] Last data filed at 12/21/2019 0600 Gross per 24 hour  Intake 1200 ml  Output 3320 ml  Net -2120 ml    Current Facility-Administered Medications:    0.9 %  sodium chloride infusion (Manually program via Guardrails IV Fluids), , Intravenous, Once, Seidy Labreck, Joyice Faster, MD   acetaminophen (TYLENOL) tablet 650 mg, 650 mg, Oral, Q6H PRN, 650 mg at 12/20/19 1052 **OR** acetaminophen (TYLENOL) suppository 650 mg, 650 mg, Rectal, Q6H PRN, Judith Part, MD   amLODipine (NORVASC) tablet 10 mg, 10 mg, Oral, Daily, Ivadell Gaul A, MD, 10 mg at 12/20/19 1047   atorvastatin (LIPITOR) tablet 40 mg, 40 mg, Oral, q1800, Judith Part, MD, 40 mg at 12/19/19 1803   bisacodyl (DULCOLAX) suppository 10 mg, 10 mg, Rectal, Daily PRN, Judith Part, MD   carvedilol (COREG) tablet 3.125 mg, 3.125 mg, Oral, BID WC, Zaccheaus Storlie A, MD, 3.125 mg at 12/21/19  0757   Chlorhexidine Gluconate Cloth 2 % PADS 6 each, 6 each, Topical, Daily, Judith Part, MD, 6 each at 12/20/19 1047   docusate sodium (COLACE) capsule 100 mg, 100 mg, Oral, BID, Judith Part, MD, 100 mg at 12/19/19 2300   HYDROcodone-acetaminophen (NORCO/VICODIN) 5-325 MG per tablet 1-2 tablet, 1-2 tablet, Oral, Q4H PRN, Judith Part, MD, 2 tablet at 12/21/19 0757   HYDROmorphone (DILAUDID) injection 0.5-1 mg, 0.5-1 mg, Intravenous, Q2H PRN, Judith Part, MD, 1 mg at 12/20/19 2203   multivitamin with minerals tablet 1 tablet, 1 tablet, Oral, Daily, Judith Part, MD, 1 tablet at 12/19/19 0954   ondansetron (ZOFRAN) tablet 4 mg, 4 mg, Oral, Q6H PRN **OR** ondansetron (ZOFRAN) injection 4 mg, 4 mg, Intravenous, Q6H PRN, Magali Bray, Joyice Faster, MD   pantoprazole (PROTONIX) EC tablet 40 mg, 40 mg, Oral, Daily, Adin Laker A, MD, 40 mg at 12/19/19 0955   polyethylene glycol (MIRALAX / GLYCOLAX) packet 17 g, 17 g, Oral, Daily PRN, Judith Part, MD   remifentanil (ULTIVA) 2 mg in 100 mL normal saline (20 mcg/mL) Optime, 0.0125 mcg/kg/min, Intravenous, To OR, Derk Doubek, Joyice Faster, MD   sodium phosphate (FLEET) 7-19  GM/118ML enema 1 enema, 1 enema, Rectal, Once PRN, Jamarrion Budai, Joyice Faster, MD  Neurologic exam: Awake/alert, Ox3, FCx4 without preference, PERRL, EOMI, FS & symmetrically sensate, TM, no drift, +appendicular ataxia on R  Assessment & Plan: 64 y.o. 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 6/2 s/p retrosig craniotomy for tumor resection  Neuro -MRI today w/wo contrast to evaluate for extent of expected residual tumor, okay to go to MRI w/o nursing -transfer  to stepdown -final path pending  Cardiopulm/Heme/ID No issues  FENGI -ADAT, d/c dex  PPx/Dispo -PT/OT recs -SCDs/TEDs, start [facility] tomorrow 6/4  Judith Part  12/21/19 8:11 AM

## 2019-12-21 NOTE — Anesthesia Postprocedure Evaluation (Signed)
Anesthesia Post Note  Patient: Belinda Berg  Procedure(s) Performed: CRANIOTOMY TUMOR RESECTION (Right Head) APPLICATION OF CRANIAL NAVIGATION (N/A )     Patient location during evaluation: PACU Anesthesia Type: General Level of consciousness: patient cooperative Pain management: pain level controlled Vital Signs Assessment: post-procedure vital signs reviewed and stable Respiratory status: spontaneous breathing, nonlabored ventilation, respiratory function stable and patient connected to nasal cannula oxygen Cardiovascular status: blood pressure returned to baseline and stable Postop Assessment: no apparent nausea or vomiting Anesthetic complications: no    Last Vitals:  Vitals:   12/21/19 0000 12/21/19 0100  BP: 135/88 (!) 152/81  Pulse: 71 65  Resp: 17 17  Temp: 37.1 C   SpO2: 100% 100%    Last Pain:  Vitals:   12/21/19 0100  TempSrc:   PainSc: Asleep                 Doctor Sheahan

## 2019-12-22 ENCOUNTER — Encounter: Payer: Self-pay | Admitting: *Deleted

## 2019-12-22 ENCOUNTER — Inpatient Hospital Stay (HOSPITAL_COMMUNITY): Payer: Self-pay

## 2019-12-22 DIAGNOSIS — M79609 Pain in unspecified limb: Secondary | ICD-10-CM

## 2019-12-22 DIAGNOSIS — R609 Edema, unspecified: Secondary | ICD-10-CM

## 2019-12-22 MED ORDER — GADOBUTROL 1 MMOL/ML IV SOLN
7.0000 mL | Freq: Once | INTRAVENOUS | Status: AC | PRN
  Administered 2019-12-22: 7.5 mL via INTRAVENOUS

## 2019-12-22 MED ORDER — ASPIRIN EC 81 MG PO TBEC
81.0000 mg | DELAYED_RELEASE_TABLET | Freq: Every day | ORAL | 2 refills | Status: DC
Start: 2019-12-22 — End: 2020-03-08

## 2019-12-22 MED ORDER — HYDROCODONE-ACETAMINOPHEN 5-325 MG PO TABS: 1.0000 | ORAL_TABLET | ORAL | 0 refills | Status: DC | PRN

## 2019-12-22 MED ORDER — HEPARIN SODIUM (PORCINE) 5000 UNIT/ML IJ SOLN
5000.0000 [IU] | Freq: Three times a day (TID) | INTRAMUSCULAR | Status: DC
  Administered 2019-12-22: 5000 [IU] via SUBCUTANEOUS
  Filled 2019-12-22: qty 1

## 2019-12-22 NOTE — TOC Transition Note (Addendum)
Transition of Care [facility]) - CM/SW Discharge Note   Patient Details  Name: Belinda Berg MRN: [id number] Date of Birth: October 02, 1955  [facility] of [facility] CM/SW Contact:  Verdell Carmine, RN Phone Number: 12/22/2019, 5:06 PM   Clinical Narrative:     Patient discharging to home with rollator. Patient has children with lots of support. Attempting to speak to patient, but she stated she could not understand what I was talking about and to speak to her daughter.  Her daughter was in the process of pulling the car around to gather her personal items a ready to discharge.   She can go to outpatient therapy PT OT, her primary can order it for her. She has PCP listed as Marily Lente Bonsu I spoke to her daughter who is ok with outpatient PT OT.  Soon after I spoke with daughter the son called and wished to talk to Case Manager. I will call hm back.    Final next level of care: Home/Self Care Barriers to Discharge: No [facility] will accept this patient, Inadequate or no insurance   Patient Goals and CMS Choice Patient states their goals for this hospitalization and ongoing recovery are:: Go [facility]      Discharge Placement  Home with rollator. Will offer  Outpatient therapy with [facility]. She can follow up on Monday with primary to order outpatient PT. Marland Kitchen                      Discharge Plan and Services   Home self care with rollator.   Outpatient service PT/OT              DME Arranged: Walker rolling with seat DME Agency: [employer] Date DME Agency Contacted: 12/22/19 Time DME Agency Contacted: [id number] Representative spoke with at DME Agency: [place]: (Called [facility]  who is charity but they could not provide services)        Social Determinants of Health (SDOH) Interventions     Readmission Risk Interventions No flowsheet data found.

## 2019-12-22 NOTE — Evaluation (Signed)
Occupational Therapy Evaluation Patient Details Name: Belinda Berg MRN: [id number] DOB: 1955/12/15 Today's Date: 12/22/2019    History of Present Illness 64 year old female who presented to [facility] emergency department for a 10/10 headache. 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 Pt underwent craniotomy and resection on 6/2.   Clinical Impression   PTA, pt was living with her son and was independent and caring for her grandchildren. Pt currently performing ADLs and functional mobility with RW at [place] level. Pt requiring increased time throughout for processing and following commands. Difficulty assessing cognition with language barrier; pt stating "It is fine" in response to questions and commands. Answered questions in preparation for dc later today per NS.  Recommend dc to home with HHOT for further OT to optimize safety, independence with ADLs, and return to PLOF.    Follow Up Recommendations  Home health OT;Supervision/Assistance - 24 hour    Equipment Recommendations  None recommended by OT    Recommendations for Other Services PT consult     Precautions / Restrictions Precautions Precautions: Fall Restrictions Weight Bearing Restrictions: No      Mobility Bed Mobility Overal bed mobility: Needs Assistance Bed Mobility: Supine to Sit;Sit to Supine     Supine to sit: Supervision Sit to supine: Supervision   General bed mobility comments: Increased time  Transfers Overall transfer level: Needs assistance Equipment used: Rolling walker (2 wheeled);None Transfers: Sit to/from Stand Sit to Stand: Supervision         General transfer comment: Supervision for safety    Balance Overall balance assessment: Needs assistance Sitting-balance support: No upper extremity supported;Feet supported Sitting  balance-Leahy Scale: Good Sitting balance - Comments: supervision at edge of bed   Standing balance support: No upper extremity supported;During functional activity Standing balance-Leahy Scale: Fair Standing balance comment: close supervision with unilateral UE support, supervision with BUE support                           ADL either performed or assessed with clinical judgement   ADL Overall ADL's : Needs assistance/impaired                                       General ADL Comments: Pt performing ADLs and functional mobility at [facility] A level. Pt performing grooming at sink, toileting, and functional mobility. Requiring increased time throughout for processing.      Vision         Perception     Praxis      Pertinent Vitals/Pain Pain Assessment: No/denies pain     Hand Dominance Right   Extremity/Trunk Assessment Upper Extremity Assessment Upper Extremity Assessment: Overall WFL for tasks assessed   Lower Extremity Assessment Lower Extremity Assessment: Overall WFL for tasks assessed   Cervical / Trunk Assessment Cervical / Trunk Assessment: Normal   Communication Communication Communication: Prefers language other than English(Speak Twi and some english)   Cognition Arousal/Alertness: Awake/alert Behavior During Therapy: Flat affect Overall Cognitive Status: Difficult to assess                                 General Comments: Difficulty to assess with language barrier. Pt  repeating "It's fine" to different questions or commands. Pt requiring increased time throughout for processing   General Comments  HR elevating to 131 during grooming and toileting    Exercises     Shoulder Instructions      Home Living Family/patient expects to be discharged to:: Private residence Living Arrangements: Children Available Help at Discharge: Family;Available 24 hours/day Type of Home: Apartment Home Access:  Stairs to enter [employer] of Steps: flight Entrance Stairs-Rails: Right;Left Home Layout: One level     Bathroom Shower/Tub: Teacher, early years/pre: Standard     Home Equipment: None    Information followed from chart review of PT evaluation when son was present      Prior Functioning/Environment Level of Independence: Independent        Comments: was helping take care of 3 grandchildren all under 16 years old        OT Problem List: Decreased activity tolerance;Impaired balance (sitting and/or standing);Decreased knowledge of use of DME or AE;Decreased knowledge of precautions;Decreased cognition      OT Treatment/Interventions:      OT Goals(Current goals can be found in the care plan section) Acute Rehab OT Goals Patient Stated Goal: To return to independent mobility OT Goal Formulation: All assessment and education complete, DC therapy  OT Frequency:     Barriers to D/C:            Co-evaluation              AM-PAC OT "6 Clicks" Daily Activity     Outcome Measure Help from another person eating meals?: A Little Help from another person taking care of personal grooming?: A Little Help from another person toileting, which includes using toliet, bedpan, or urinal?: A Little Help from another person bathing (including washing, rinsing, drying)?: A Little Help from another person to put on and taking off regular upper body clothing?: A Little Help from another person to put on and taking off regular lower body clothing?: A Little 6 Click Score: 18   End of Session Equipment Utilized During Treatment: Rolling walker Nurse Communication: Mobility status  Activity Tolerance: Patient tolerated treatment well Patient left: in bed;with call bell/phone within reach  OT Visit Diagnosis: Unsteadiness on feet (R26.81);Other abnormalities of gait and mobility (R26.89);Muscle weakness (generalized) (M62.81)                Time: 1443-1540 OT  Time Calculation (min): 18 min Charges:  OT General Charges $OT Visit: 1 Visit OT Evaluation $OT Eval Moderate Complexity: Brave, OTR/L Acute Rehab Pager: [phone] Office: [place] 12/22/2019, 4:21 PM

## 2019-12-22 NOTE — Progress Notes (Signed)
Rolling walker delivered to room. Son called and upset about patient not receiving home PT, requesting to speak with SW/CM regarding this. Called [place] [place] to help with this situation prior to discharge.

## 2019-12-22 NOTE — Progress Notes (Signed)
Neurosurgery Service Progress Note  Subjective: NAE ON, recovering well  Objective: Vitals:   12/21/19 1715 12/21/19 1921 12/22/19 0034 12/22/19 0741  BP: (!) 151/104 (!) 124/98 (!) 142/98 (!) 159/92  Pulse: (!) 107 92 87 (!) 112  Resp: 16 (!) 26 18 (!) 24  Temp: 98.6 F (37 C) 97.8 F (36.6 C) 98.7 F (37.1 C) 97.6 F (36.4 C)  TempSrc: Oral   Oral  SpO2: 96% 96% 98% 93%  Weight:      Height:       Temp (24hrs), Avg:98.2 F (36.8 C), Min:97.6 F (36.4 C), Max:98.7 F (37.1 C)  CBC Latest Ref Rng & Units 12/20/2019 12/20/2019 12/18/2019  WBC 4.0 - 10.5 K/uL - - 13.2(H)  Hemoglobin 12.0 - 15.0 g/dL 9.2(L) 8.2(L) 13.0  Hematocrit 36.0 - 46.0 % 27.0(L) 24.0(L) 39.7  Platelets 150 - 400 K/uL - - 297   BMP Latest Ref Rng & Units 12/20/2019 12/20/2019 12/18/2019  Glucose 70 - 99 mg/dL - - 162(H)  BUN 8 - 23 mg/dL - - 18  Creatinine 0.44 - 1.00 mg/dL - - 0.66  Sodium 135 - 145 mmol/L 144 145 141  Potassium 3.5 - 5.1 mmol/L 4.1 3.5 3.6  Chloride 98 - 111 mmol/L - - 107  CO2 22 - 32 mmol/L - - 23  Calcium 8.9 - 10.3 mg/dL - - 9.2    Intake/Output Summary (Last 24 hours) at 12/22/2019 1022 Last data filed at 12/22/2019 0827 Gross per 24 hour  Intake --  Output 1400 ml  Net -1400 ml    Current Facility-Administered Medications:  .  0.9 %  sodium chloride infusion (Manually program via Guardrails IV Fluids), , Intravenous, Once, Ithan Touhey, Joyice Faster, MD .  acetaminophen (TYLENOL) tablet 650 mg, 650 mg, Oral, Q6H PRN, 650 mg at 12/20/19 1052 **OR** acetaminophen (TYLENOL) suppository 650 mg, 650 mg, Rectal, Q6H PRN, Judith Part, MD .  amLODipine (NORVASC) tablet 10 mg, 10 mg, Oral, Daily, Lanaysia Fritchman, Joyice Faster, MD, 10 mg at 12/22/19 0815 .  atorvastatin (LIPITOR) tablet 40 mg, 40 mg, Oral, q1800, Judith Part, MD, 40 mg at 12/21/19 1720 .  bisacodyl (DULCOLAX) suppository 10 mg, 10 mg, Rectal, Daily PRN, Judith Part, MD .  carvedilol (COREG) tablet 3.125 mg, 3.125  mg, Oral, BID WC, Yazmeen Woolf, Joyice Faster, MD, 3.125 mg at 12/22/19 0815 .  Chlorhexidine Gluconate Cloth 2 % PADS 6 each, 6 each, Topical, Daily, Judith Part, MD, 6 each at 12/21/19 1000 .  docusate sodium (COLACE) capsule 100 mg, 100 mg, Oral, BID, Judith Part, MD, 100 mg at 12/22/19 0814 .  HYDROcodone-acetaminophen (NORCO/VICODIN) 5-325 MG per tablet 1-2 tablet, 1-2 tablet, Oral, Q4H PRN, Judith Part, MD, 2 tablet at 12/22/19 0814 .  HYDROmorphone (DILAUDID) injection 0.5-1 mg, 0.5-1 mg, Intravenous, Q2H PRN, Judith Part, MD, 1 mg at 12/20/19 2203 .  multivitamin with minerals tablet 1 tablet, 1 tablet, Oral, Daily, Judith Part, MD, 1 tablet at 12/22/19 0815 .  ondansetron (ZOFRAN) tablet 4 mg, 4 mg, Oral, Q6H PRN **OR** ondansetron (ZOFRAN) injection 4 mg, 4 mg, Intravenous, Q6H PRN, Brittane Grudzinski A, MD .  pantoprazole (PROTONIX) EC tablet 40 mg, 40 mg, Oral, Daily, Judith Part, MD, 40 mg at 12/22/19 0814 .  polyethylene glycol (MIRALAX / GLYCOLAX) packet 17 g, 17 g, Oral, Daily PRN, Judith Part, MD, 17 g at 12/22/19 0814 .  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 6/2 s/p retrosig craniotomy for tumor resection  Neuro -MRI today - must be done today to accurately represent resection to serve as baseline, after today scar tissue will start enhancing and we'll lose our  baseline -final path pending  Cardiopulm/Heme/ID -some new leg pain overnight, duplex ordered and pending, no SOB / CP / hypoxemia  FENGI -ADAT, d/c dex  PPx/Dispo -PT/OT recs -SCDs/TEDs, start [place]  12/22/19 10:22 AM

## 2019-12-22 NOTE — Progress Notes (Signed)
Left lower extremity venous duplex completed. Refer to "CV Proc" under chart review to view preliminary results.  12/22/2019 2:29 PM Kelby Aline., MHA, RVT, RDCS, RDMS

## 2019-12-22 NOTE — Discharge Instructions (Signed)
Discharge Instructions  No restriction in activities, slowly increase your activity back to normal.   Your incision is closed with absorbable sutures. These will naturally fall off over the next 4-6 weeks. If they become bothersome or cause discomfort, apply some antibiotic ointment like bacitracin or neosporin on the sutures. This will soften them up and usually makes them more comfortable while they dissolve.  Okay to shower on the day of discharge. Be gentle when cleaning your incision. Use regular soap and water. If that is uncomfortable, try using baby shampoo. Do not submerge the wound under water for 2 weeks after surgery.  Follow up with Dr. Lyndi Holbein in 2 weeks after discharge. If you do not already have a discharge appointment, please call his office at [phone] to schedule a follow up appointment. If you have any concerns or questions, please call the office and let us know. 

## 2019-12-22 NOTE — Plan of Care (Signed)
  Problem: Health Behavior/Discharge Planning: Goal: Ability to manage health-related needs will improve Note: Received discharge order for patient. Discharge instructions reviewed with her son Ismial, including new medication/ prescriptions, activity, follow-up appointment, wound care, and any special instructions. Her son is aware to call PCP for outpatient therapy orders and to call [facility] office to schedule follow up appointment per discharge instructions. Also, gave him information on stool softener and laxative medication he can speak to PCP about to relieve constipation. At this time patient have stated understanding of discharge instructions. Patient with no further questions at this time. Pt stable in no acute distress. PIV removed, intact. Pt off unit in wheelchair. Nursing care complete.

## 2019-12-22 NOTE — Progress Notes (Signed)
Physical Therapy Treatment Patient Details Name: Belinda Berg MRN: [id number] DOB: [birth year] Today's Date: 12/22/2019    History of Present Illness 64 year old female who presented to [facility] emergency department for a 10/10 headache. 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 Pt underwent craniotomy and resection on 6/2.    PT Comments    Pt tolerates treatment well although is tachycardic during session and demonstrates some increased work of breathing with mobility. Pt ambulates without use of RW, however demonstrates significant reduction in gait speed and remains much steadier with use of RW at this time. Pt also initiates stair training and is able to slowly ascend and descend steps at this time with use of railing. Pt will continue to benefit from acute PT POC to improve activity tolerance and aide in a return to independence. PT continues to recommend HHPT at this time as well as a RW.   Follow Up Recommendations  Home health PT;Supervision for mobility/OOB     Equipment Recommendations  Rolling walker with 5" wheels    Recommendations for Other Services       Precautions / Restrictions Precautions Precautions: Fall Restrictions Weight Bearing Restrictions: No    Mobility  Bed Mobility Overal bed mobility: Modified Independent Bed Mobility: Supine to Sit     Supine to sit: Modified independent (Device/Increase time)     General bed mobility comments: increased time  Transfers Overall transfer level: Needs assistance Equipment used: Rolling walker (2 wheeled);None Transfers: Sit to/from Stand Sit to Stand: Supervision;Min guard(minG without device, supervision with RW)            Ambulation/Gait Ambulation/Gait assistance: Min guard;Supervision Gait Distance (Feet): 80 Feet(80' x 2, seated rest break 2/2  tachy) Assistive device: Rolling walker (2 wheeled);None Gait Pattern/deviations: Step-through pattern Gait velocity: reduced Gait velocity interpretation: 1.31 - 2.62 ft/sec, indicative of limited community ambulator General Gait Details: pt with shortened step to gait initially without RW, increasing stride length with PT verbal cues but gait remains slowed. With use of RW pt with steady step through gait with increased gait speed   Stairs Stairs: Yes Stairs assistance: Min guard Stair Management: One rail Right;Forwards Number of Stairs: 6 General stair comments: pt initially ascendes steps alternating step over step, last 2 steps with step to pattern. Descends with step to pattern   Wheelchair Mobility    Modified Rankin (Stroke Patients Only)       Balance Overall balance assessment: Needs assistance Sitting-balance support: No upper extremity supported;Feet supported Sitting balance-Leahy Scale: Good Sitting balance - Comments: supervision at edge of bed   Standing balance support: Single extremity supported Standing balance-Leahy Scale: Fair Standing balance comment: close supervision with unilateral UE support, supervision with BUE support                            Cognition Arousal/Alertness: Awake/alert Behavior During Therapy: Flat affect Overall Cognitive Status: Within Functional Limits for tasks assessed                                        Exercises      General Comments General comments (skin integrity, edema, etc.): pt tachy during session, 110s at rest and up to 144 with mobility. HR does  recover to 110s with seated rest break after stairs, however does return to 130-140s with toileting      Pertinent Vitals/Pain Pain Assessment: No/denies pain    Home Living                      Prior Function            PT Goals (current goals can now be found in the care plan section) Acute Rehab PT Goals Patient  Stated Goal: To return to independent mobility Progress towards PT goals: Progressing toward goals    Frequency    Min 4X/week      PT Plan Current plan remains appropriate    Co-evaluation              AM-PAC PT "6 Clicks" Mobility   Outcome Measure  Help needed turning from your back to your side while in a flat bed without using bedrails?: None Help needed moving from lying on your back to sitting on the side of a flat bed without using bedrails?: None Help needed moving to and from a bed to a chair (including a wheelchair)?: None Help needed standing up from a chair using your arms (e.g., wheelchair or bedside chair)?: None Help needed to walk in hospital room?: None Help needed climbing 3-5 steps with a railing? : A Little 6 Click Score: 23    End of Session   Activity Tolerance: Patient tolerated treatment well Patient left: in bed;with call bell/phone within reach;with family/visitor present Nurse Communication: Mobility status PT Visit Diagnosis: Other abnormalities of gait and mobility (R26.89);Unsteadiness on feet (R26.81)     Time: 0677-0340 PT Time Calculation (min) (ACUTE ONLY): 23 min  Charges:  $Gait Training: 23-37 mins                     Zenaida Niece, PT, DPT Acute Rehabilitation Pager: [phone]    Zenaida Niece 12/22/2019, 1:27 PM

## 2019-12-22 NOTE — Progress Notes (Signed)
Son, Jones Broom, called stated if interpreter is needed for MRI to please call him. Mervin Hack RN

## 2019-12-22 NOTE — Care Management (Signed)
Son up to room,  Asked how much home health PT OT costs would be out of pocket.  Called [facility], [facility], [facility],  Bea Graff is checking with their administration and will follow up with me tomorrow. All others do not accept private pay or self pay.Made son aware of

## 2019-12-22 NOTE — Progress Notes (Addendum)
Notified by IR of MRI results. Left message for Lafe Garin, MD.

## 2019-12-22 NOTE — Discharge Summary (Signed)
Discharge Summary  Date of Admission: 12/17/2019  Date of Discharge: 12/22/19  Attending Physician: Emelda Brothers, MD  Hospital Course: Patient was admitted with worsening headaches and ataxia with hydrocephalus. CT/MRI showed a very large posterior fossa tumor with ventriculomegaly. She was taken to the OR for a retrosigmoid craniotomy for tumor resection on 12/20/19. She was recovered in PACU and transferred to 4N. Post-op MRI showed good debulking of the tumor with a small sinus filling defect. Her hospital course was uncomplicated and the patient was discharged home on 12/22/19. She will follow up in clinic with me in 2 weeks. She will start ASA81 on POD3 for the sinus filling defect. She also had some leg pain during her admission, a duplex was obtained which showed no evidence of DVT.  Neurologic exam at discharge:  AOx3, PERRL, EOMI, FS, TM Strength 5/5 x4, SILTx4  Discharge diagnosis: Brain tumor  Judith Part, MD 12/22/19 3:00 PM

## 2019-12-22 NOTE — Care Management (Addendum)
Son upset wanting to speak to care  Manager. Called son. He stated he is from [place]. He stated he needs to go back tomorrow. His sister lives with his mother and  has two small children, she cannot get their mother to PT. He demanded that we get someone in to their house to do PT or send her to a nursing home where she can get these services.  I informed him that  she would not be able to go to nursing home due to no insurance.   I told him we are trying to accommodate as much as possible to try to help get the services. The PT and OT outpatient would be on a sliding scale.  He stated he wanted written document stating why we could not get services to his mother. He wants it for his legal team.   He hung up the phone after he stated he was on his way up. I will refer him to patient experience liaisons.  Email written to patient experience  With contact information. Care Manager supervisor aware of issues . We have done all we can do to set this patient up , but we cannot provide services at this time in the home.

## 2019-12-23 LAB — TYPE AND SCREEN
ABO/RH(D): B POS
Antibody Screen: NEGATIVE
Unit division: 0
Unit division: 0
Unit division: 0
Unit division: 0

## 2019-12-23 LAB — BPAM RBC
Blood Product Expiration Date: 202106232359
Blood Product Expiration Date: 202106242359
Blood Product Expiration Date: 202106242359
Blood Product Expiration Date: 202106252359
ISSUE DATE / TIME: 202106021553
ISSUE DATE / TIME: 202106021553
Unit Type and Rh: 7300
Unit Type and Rh: 7300
Unit Type and Rh: 7300
Unit Type and Rh: 7300

## 2019-12-25 ENCOUNTER — Telehealth: Payer: Self-pay

## 2019-12-25 NOTE — Telephone Encounter (Signed)
Incoming call from son to get a update on PT services. I had texted earlier stating Alvis Lemmings could come to the house for 185 a session.  He is seeing if outpatient therapy is a option and how much that would be [facility] outpatient is 100-300 for assessment with 50% off up front. They gave information regarding [facility], in which you apply for medicaid get turned down and then  You can set up to make payments. However the patient is not eligible for medicaid, as she has only had her green card for 3 years. Self pay at [facility] orthopedic is 75-80 a visit with the first assessment at 250 with 40% off if you pay upfront. I called Dr Ruthine Dose office and spoke to his nurse to see if she had any ideas, She will speak to Dr Zada Finders as well. She spoke about someone receiving a letter for charity care. This prompted me to call finacials, [employer], who sent me to Shanon Rosser to see what the patient was eligible for, since she cannot apply for Medicaid. I am awaiting a call back  And then will touch base with her son.

## 2019-12-26 LAB — SURGICAL PATHOLOGY

## 2020-01-05 ENCOUNTER — Other Ambulatory Visit: Payer: Self-pay | Admitting: Radiation Therapy

## 2020-01-08 ENCOUNTER — Other Ambulatory Visit: Payer: Self-pay | Admitting: Neurological Surgery

## 2020-01-08 ENCOUNTER — Other Ambulatory Visit (HOSPITAL_COMMUNITY): Payer: Self-pay | Admitting: Neurological Surgery

## 2020-01-08 DIAGNOSIS — G919 Hydrocephalus, unspecified: Secondary | ICD-10-CM

## 2020-01-09 ENCOUNTER — Ambulatory Visit (HOSPITAL_COMMUNITY)
Admission: RE | Admit: 2020-01-09 | Discharge: 2020-01-09 | Disposition: A | Discharge status: Home / Self Care | Payer: Self-pay | Source: Ambulatory Visit | Attending: Cardiovascular Disease | Admitting: Cardiovascular Disease

## 2020-01-09 ENCOUNTER — Other Ambulatory Visit: Payer: Self-pay

## 2020-01-09 ENCOUNTER — Other Ambulatory Visit (HOSPITAL_COMMUNITY): Payer: Self-pay | Admitting: Internal Medicine

## 2020-01-09 DIAGNOSIS — M79661 Pain in right lower leg: Secondary | ICD-10-CM | POA: Insufficient documentation

## 2020-01-09 DIAGNOSIS — M7989 Other specified soft tissue disorders: Secondary | ICD-10-CM | POA: Insufficient documentation

## 2020-01-18 ENCOUNTER — Other Ambulatory Visit: Payer: Self-pay | Admitting: Radiation Therapy

## 2020-01-18 ENCOUNTER — Ambulatory Visit (HOSPITAL_COMMUNITY): Payer: Self-pay

## 2020-01-18 ENCOUNTER — Encounter (HOSPITAL_COMMUNITY): Payer: Self-pay

## 2020-01-31 ENCOUNTER — Other Ambulatory Visit: Payer: Self-pay

## 2020-01-31 ENCOUNTER — Ambulatory Visit: Payer: 59 | Attending: Internal Medicine | Admitting: Physical Therapy

## 2020-01-31 DIAGNOSIS — M6281 Muscle weakness (generalized): Secondary | ICD-10-CM | POA: Insufficient documentation

## 2020-01-31 DIAGNOSIS — R2681 Unsteadiness on feet: Secondary | ICD-10-CM | POA: Diagnosis present

## 2020-01-31 DIAGNOSIS — R2689 Other abnormalities of gait and mobility: Secondary | ICD-10-CM | POA: Insufficient documentation

## 2020-02-01 NOTE — Therapy (Signed)
[facility] [address] [place], [place], [address] Phone: [phone]   Fax:  [phone]  Physical Therapy Evaluation  Patient Details  Name: Belinda Berg MRN: [id number] Date of Birth: 1955/09/14 Referring Provider (PT): Benito Mccreedy, MD   Encounter Date: 01/31/2020   PT End of Session - 02/01/20 2056    Visit Number 1    Number of Visits 5    Authorization Type [employer] (card presented at eval)    PT Start Time 1705    PT Stop Time 1748    PT Time Calculation (min) 43 min    Activity Tolerance Patient limited by fatigue;Patient tolerated treatment well   Short of breath after gait; O2 sats WNL   Behavior During Therapy [facility] for tasks assessed/performed           Past Medical History:  Diagnosis Date  . Hypertension     Past Surgical History:  Procedure Laterality Date  . APPLICATION OF CRANIAL NAVIGATION N/A 12/20/2019   Procedure: APPLICATION OF CRANIAL NAVIGATION;  Surgeon: Judith Part, MD;  Location: [facility];  Service: Neurosurgery;  Laterality: N/A;  . CRANIOTOMY Right 12/20/2019   Procedure: CRANIOTOMY TUMOR RESECTION;  Surgeon: Judith Part, MD;  Location: [facility];  Service: Neurosurgery;  Laterality: Right;    There were no vitals filed for this visit.    Subjective Assessment - 01/31/20 1713    Subjective Daughter provides most of history; had craniotomy for tumor resection 12/20/2019.  Had severe headaches prior to the surgery.  Was given a walker prior to leaving the hospital but is not using.  No reports of falls in the past 6 months.  When asked, both patient and daughter report she is doing pretty much back to normal.    Patient is accompained by: Family member   Daughter   Patient Stated Goals Wants to regain her strength to do her daily activities.    Currently in Pain? No/denies   ocassional incision pain             [facility] PT Assessment - 01/31/20 1721       Assessment   Medical Diagnosis tumor resection, s/p craniotomy    Referring Provider (PT) Benito Mccreedy, MD    Onset Date/Surgical Date 12/20/19    Hand Dominance Right      Precautions   Precautions Other (comment)    Precaution Comments Incision still healing, no restrictions per daughter      Balance Screen   Has the patient fallen in the past 6 months No    Has the patient had a decrease in activity level because of a fear of falling?  Yes    Is the patient reluctant to leave their home because of a fear of falling?  Yes      [place] residence    Living Arrangements Children   Does visit son in [place]   Available Help at Discharge Family   Daughter works, so home alone during the day   Type of [place] to enter    Entrance Stairs-Number of Steps 13    Entrance Stairs-Rails Right;Left;Cannot reach both    [place] One level   2nd story apartment   Shorter - 2 wheels;Cane - single point      Prior Function   Level of Independence Independent    Vocation Other (comment)   Kept her  grandchildren   Leisure did cleaning, cooking, walking in neighborhood      Observation/Other Assessments   Focus on Therapeutic Outcomes (FOTO)  NA      ROM / Strength   AROM / PROM / Strength AROM;Strength      AROM   Overall AROM  Within functional limits for tasks performed      Strength   Overall Strength Deficits    Overall Strength Comments Grossly tested 4+/5 BLES, except L ankle dorsiflexion 3+/5.      Transfers   Transfers Sit to Stand;Stand to Sit    Sit to Stand 6: Modified independent (Device/Increase time)    Five time sit to stand comments  10.43    Stand to Sit 6: Modified independent (Device/Increase time)      Ambulation/Gait   Ambulation/Gait Yes    Ambulation/Gait Assistance 5: Supervision    Ambulation Distance (Feet) 200 Feet    Assistive device None    Gait Pattern Step-through  pattern;Wide base of support    Ambulation Surface Level;Indoor    Gait velocity 8.94 sec = 3.67 ft/sec      Standardized Balance Assessment   Standardized Balance Assessment Timed Up and Go Test;Dynamic Gait Index      Dynamic Gait Index   Level Surface Normal    Change in Gait Speed Normal    Gait with Horizontal Head Turns Mild Impairment    Gait with Vertical Head Turns Mild Impairment    Gait and Pivot Turn Normal    Step Over Obstacle Mild Impairment    Step Around Obstacles Mild Impairment    Steps Mild Impairment    Total Score 19    DGI comment: Scores >19/24 indicate increased fall risk      Timed Up and Go Test   Normal TUG (seconds) 12.53    TUG Comments Scores >13.5 sec indicate increased fall risk.           SLS >3 seconds LLE; 1.4 sec RLE           Objective measurements completed on examination: See above findings.               PT Education - 02/01/20 2055    Education Details PT eval results, POC    Person(s) Educated Patient;Child(ren)   daughter   Methods Explanation    Comprehension Verbalized understanding               PT Long Term Goals - 02/01/20 2103      PT LONG TERM GOAL #1   Title Pt will be independent with HEP for improved strength, balance, and overall endurance for return to PLOF.  TARGET:  02/29/2020    Time 4    Period Weeks    Status New      PT LONG TERM GOAL #2   Title Pt will improve DGI to at least 21/24 for decreased fall risk.    Time 4    Period Weeks    Status New      PT LONG TERM GOAL #3   Title 3 MWT to be assessed, with goal to be written as appropriate.    Time 4    Period Weeks    Status New      PT LONG TERM GOAL #4   Title Pt/family will verbalize understanding of fall prevention in the home environment.    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  She underwent craniotomy for tumor resection 12/20/2019.  She was discharged home with several visits of HHPT, per daughter report.  She presents to OPPT with decreased strength, decreased dynamic balance, decreased activity tolerance.  Prior to surgery, she was independent and keeping her grandchildren while daughter worked.  Pt would benefit from skilled OPPT to address the above stated deficits to decrease fall risk and improve overall functional mobility for return to independence.    Personal Factors and Comorbidities Transportation;Comorbidity 2    Comorbidities Hx of headaches, posterior fossa tumor s/p resection    Examination-Activity Limitations Caring for Others;Locomotion Level    Examination-Participation Restrictions Community Activity;Meal Prep    Stability/Clinical Decision Making Stable/Uncomplicated    Clinical Decision Making Low    Rehab Potential Good    PT Frequency 1x / week    PT Duration 4 weeks   plus eval   PT Treatment/Interventions ADLs/Self Care Home Management;Gait training;Stair training;Functional mobility training;Therapeutic activities;Therapeutic exercise;Balance training;Neuromuscular re-education;Patient/family education    PT Next Visit Plan Initiate HEP for strength, balance; assess 3MWT and write goal as appropriate; walking program for HEP    Consulted and Agree with Plan of Care Patient;Family member/caregiver    Family Member Consulted daughter           Patient will benefit from skilled therapeutic intervention in order to improve the following deficits and impairments:  Abnormal gait, Difficulty walking, Decreased activity tolerance, Decreased endurance, Decreased balance, Decreased mobility, Decreased strength  Visit Diagnosis: Other abnormalities of gait and mobility  Unsteadiness on feet  Muscle weakness (generalized)     Problem  List Patient Active Problem List   Diagnosis Date Noted  . Brain tumor ([place]) 12/17/2019    Belinda Berg W. 02/01/2020, 9:05 PM Frazier Butt., PT  [facility] [address] [place], [place], [address] Phone: [phone]   Fax:  [phone]  Name: Belinda Berg MRN: [id number] Date of Birth: [birth year]

## 2020-02-07 ENCOUNTER — Other Ambulatory Visit: Payer: Self-pay

## 2020-02-07 ENCOUNTER — Other Ambulatory Visit (HOSPITAL_COMMUNITY): Payer: Self-pay

## 2020-02-07 ENCOUNTER — Ambulatory Visit: Payer: 59 | Admitting: Physical Therapy

## 2020-02-07 DIAGNOSIS — R2689 Other abnormalities of gait and mobility: Secondary | ICD-10-CM | POA: Diagnosis not present

## 2020-02-07 DIAGNOSIS — M6281 Muscle weakness (generalized): Secondary | ICD-10-CM

## 2020-02-07 DIAGNOSIS — R2681 Unsteadiness on feet: Secondary | ICD-10-CM

## 2020-02-07 NOTE — Therapy (Signed)
[facility] [address] [place], [place], [address] Phone: [phone]   Fax:  [phone]  Physical Therapy Treatment  Patient Details  Name: Belinda Berg MRN: [id number] Date of Birth: Jul 31, 1955 Referring Provider (PT): Benito Mccreedy, MD   Encounter Date: 02/07/2020   PT End of Session - 02/07/20 1841    Visit Number 2    Number of Visits 5    Authorization Type [employer]-20% of visit towards OOPM; 30 VL    PT Start Time 9833    PT Stop Time 1829    PT Time Calculation (min) 41 min    Activity Tolerance Patient tolerated treatment well   Pt requires several seated rest breaks   Behavior During Therapy [facility] for tasks assessed/performed           Past Medical History:  Diagnosis Date  . Hypertension     Past Surgical History:  Procedure Laterality Date  . APPLICATION OF CRANIAL NAVIGATION N/A 12/20/2019   Procedure: APPLICATION OF CRANIAL NAVIGATION;  Surgeon: Judith Part, MD;  Location: [facility];  Service: Neurosurgery;  Laterality: N/A;  . CRANIOTOMY Right 12/20/2019   Procedure: CRANIOTOMY TUMOR RESECTION;  Surgeon: Judith Part, MD;  Location: [facility];  Service: Neurosurgery;  Laterality: Right;    There were no vitals filed for this visit.   Subjective Assessment - 02/07/20 1754    Subjective Sometimes have pain in legs, but the medication takes care of it.  To go to CT scan tomorrow.    Patient is accompained by: Family member   Daughter   Patient Stated Goals Wants to regain her strength to do her daily activities.    Currently in Pain? No/denies                             [facility] Adult PT Treatment/Exercise - 02/07/20 0001      Ambulation/Gait   Ambulation/Gait Yes    Ambulation/Gait Assistance 5: Supervision    Ambulation Distance (Feet) 850 Feet   115   Assistive device None    Gait Pattern Step-through pattern;Wide base of support    Ambulation Surface  Level;Indoor    Gait Comments 780 ft in 3 minute walk:  98% O2, 124 bpm comes down to 108 bpm.           Initial O2 sats 98% and HR 86 prior to initiation of gait.    Balance Exercises - 02/07/20 0001      OTAGO PROGRAM   Ankle Movements Sitting;10 reps    Knee Extensor 10 reps   Also seated marching x 10 reps   Knee Flexor 10 reps    Hip ABductor 10 reps    Ankle Plantorflexors --   10 reps   Ankle Dorsiflexors --   10 reps   Knee Bends 10 reps, support    Backwards Walking Support   3 laps at counter   Sideways Walking No assistive device   3 laps at counter; cues to slow pace   Tandem Stance 10 seconds, support   x 2   Tandem Walk Support   2 laps along counter; cues for visual target/posture   One Leg Stand 10 seconds, support   x 2   Sit to Stand 5 reps, bilateral support   No UE support   Overall OTAGO Comments Cues provided for technique and posture, to slow pace with exercises  PT Education - 02/07/20 1840    Education Details Provided handouts for OTAGO (see flowsheet-starting at [place]; no sidestepping)    Person(s) Educated Patient;Child(ren)    Methods Explanation;Demonstration;Verbal cues;Handout    Comprehension Verbalized understanding;Returned demonstration               PT Long Term Goals - 02/07/20 1844      PT LONG TERM GOAL #1   Title Pt will be independent with HEP for improved strength, balance, and overall endurance for return to PLOF.  TARGET:  02/29/2020    Time 4    Period Weeks    Status New      PT LONG TERM GOAL #2   Title Pt will improve DGI to at least 21/24 for decreased fall risk.    Time 4    Period Weeks    Status New      PT LONG TERM GOAL #3   Title 3 MWT to be assessed, with goal to be written as appropriate.    Baseline 780 ft in 3 minutes; no goal needed    Time 4    Period Weeks    Status Deferred      PT LONG TERM GOAL #4   Title Pt/family will verbalize understanding of fall prevention in the home  environment.    Time 4    Period Weeks    Status New                 Plan - 02/07/20 1842    Clinical Impression Statement Initiated HEP today for OTAGO program to address strength and balance. Assessed 3 MWT, with pt ambulating 780 ft in 3 minutes, which is near WNL; no goal needed for gait distance in 3MWT.  She does appear SOB at end of gait, with slightly more forward flexed posture.  With exercises, she needs cues to slow pace for optimal technique.    Personal Factors and Comorbidities Transportation;Comorbidity 2    Comorbidities Hx of headaches, posterior fossa tumor s/p resection    Examination-Activity Limitations Caring for Others;Locomotion Level    Examination-Participation Restrictions Community Activity;Meal Prep    Stability/Clinical Decision Making Stable/Uncomplicated    Rehab Potential Good    PT Frequency 1x / week    PT Duration 4 weeks   plus eval   PT Treatment/Interventions ADLs/Self Care Home Management;Gait training;Stair training;Functional mobility training;Therapeutic activities;Therapeutic exercise;Balance training;Neuromuscular re-education;Patient/family education    PT Next Visit Plan Review HEP; discuss walking program for HEP; progress exercises for strength/balance as needed    Consulted and Agree with Plan of Care Patient;Family member/caregiver    Family Member Consulted daughter           Patient will benefit from skilled therapeutic intervention in order to improve the following deficits and impairments:  Abnormal gait, Difficulty walking, Decreased activity tolerance, Decreased endurance, Decreased balance, Decreased mobility, Decreased strength  Visit Diagnosis: Unsteadiness on feet  Muscle weakness (generalized)     Problem List Patient Active Problem List   Diagnosis Date Noted  . Brain tumor ([place]) 12/17/2019    Virgie Kunda W. 02/07/2020, 6:48 PM  Frazier Butt., PT   [facility] [address] [place], [place], [address] Phone: [phone]   Fax:  [phone]  Name: Meosha Castanon MRN: [id number] Date of Birth: Dec 10, 1955

## 2020-02-08 ENCOUNTER — Ambulatory Visit (HOSPITAL_COMMUNITY)
Admission: RE | Admit: 2020-02-08 | Discharge: 2020-02-08 | Disposition: A | Discharge status: Home / Self Care | Payer: 59 | Source: Ambulatory Visit | Attending: Neurological Surgery | Admitting: Neurological Surgery

## 2020-02-08 DIAGNOSIS — G919 Hydrocephalus, unspecified: Secondary | ICD-10-CM | POA: Diagnosis present

## 2020-02-14 ENCOUNTER — Other Ambulatory Visit: Payer: Self-pay | Admitting: Radiation Therapy

## 2020-02-14 ENCOUNTER — Ambulatory Visit: Payer: 59 | Admitting: Physical Therapy

## 2020-02-14 ENCOUNTER — Encounter: Payer: Self-pay | Admitting: Physical Therapy

## 2020-02-14 ENCOUNTER — Other Ambulatory Visit: Payer: Self-pay

## 2020-02-14 DIAGNOSIS — R2689 Other abnormalities of gait and mobility: Secondary | ICD-10-CM | POA: Diagnosis not present

## 2020-02-14 DIAGNOSIS — R2681 Unsteadiness on feet: Secondary | ICD-10-CM

## 2020-02-14 NOTE — Therapy (Signed)
[facility] [address] [place], [place], [address] Phone: [phone]   Fax:  [phone]  Physical Therapy Treatment  Patient Details  Name: Belinda Berg MRN: [id number] Date of Birth: [birth year] Referring Provider (PT): Benito Mccreedy, MD   Encounter Date: 02/14/2020   PT End of Session - 02/14/20 1827    Visit Number 3    Number of Visits 5    Authorization Type [employer]-20% of visit towards OOPM; 30 VL    PT Start Time 5009    PT Stop Time 3818    PT Time Calculation (min) 38 min    Activity Tolerance Patient tolerated treatment well;Other (comment)   HR elevated after walking and standing; remains elevated (113 bpm) after seated rest   Behavior During Therapy [facility] for tasks assessed/performed           Past Medical History:  Diagnosis Date   Hypertension     Past Surgical History:  Procedure Laterality Date   APPLICATION OF CRANIAL NAVIGATION N/A 12/20/2019   Procedure: APPLICATION OF CRANIAL NAVIGATION;  Surgeon: Judith Part, MD;  Location: [facility];  Service: Neurosurgery;  Laterality: N/A;   CRANIOTOMY Right 12/20/2019   Procedure: CRANIOTOMY TUMOR RESECTION;  Surgeon: Judith Part, MD;  Location: [facility];  Service: Neurosurgery;  Laterality: Right;    There were no vitals filed for this visit.   Subjective Assessment - 02/14/20 1708    Subjective Sometimes have some body aches after the exercises; when asked, she reports it is muscle pain.    Patient is accompained by: Family member   Daughter   Patient Stated Goals Wants to regain her strength to do her daily activities.    Currently in Pain? No/denies                             [facility] Adult PT Treatment/Exercise - 02/14/20 0001      Ambulation/Gait   Ambulation/Gait Yes    Ambulation/Gait Assistance 5: Supervision    Ambulation/Gait Assistance Details Pt tends to speed up on last 2 laps, cues for  maintaining pace, heelstrike and avoiding forward lean.    Ambulation Distance (Feet) 600 Feet    Assistive device None    Gait Pattern Step-through pattern;Wide base of support    Ambulation Surface Level;Indoor    Gait Comments Assessed HR after gait:  137 bpm, goes down to 110 bpm.               Balance Exercises - 02/14/20 0001      OTAGO PROGRAM   Knee Extensor 10 reps    Knee Flexor 10 reps    Hip ABductor 10 reps    Ankle Plantorflexors --   10 reps   Ankle Dorsiflexors --   10 reps   Backwards Walking Support    Tandem Stance 10 seconds, support    Tandem Walk Support    One Leg Stand 10 seconds, support    Sit to Stand 10 reps, bilateral support   progressed to no UE support, last 5 reps   Overall OTAGO Comments REviewed HEP given last visit, with pt return demo understanding.          Self Care: Vitals assessed after standing exercises, as pt appears labored with breathing.  O2 sats 98%, HR 133 bpm.  After several minutes of rest, pursed lip/slowed breathing, pt's HR remains elevated at 113 bpm.  Assessed  BP, with BP 168/99.  Pt does not report any headache or blurred visit, but does report she has not had her medications today.  She reports she typically takes them at night; educated pt/daughter to follow up with MD for medication timing recommendations; any further symtpoms need to contact MD. (they have appt with MD tomorrow).   PT Education - 02/14/20 1826    Education Details Discussed pt's BP and HR measures with daughter; requested they make sure she is taking medication and they speak to MD about her elevated HR and BP (Pt is asymptomatic today)    Person(s) Educated Patient;Spouse    Methods Explanation    Comprehension Verbalized understanding               PT Long Term Goals - 02/07/20 1844      PT LONG TERM GOAL #1   Title Pt will be independent with HEP for improved strength, balance, and overall endurance for return to PLOF.  TARGET:   02/29/2020    Time 4    Period Weeks    Status New      PT LONG TERM GOAL #2   Title Pt will improve DGI to at least 21/24 for decreased fall risk.    Time 4    Period Weeks    Status New      PT LONG TERM GOAL #3   Title 3 MWT to be assessed, with goal to be written as appropriate.    Baseline 780 ft in 3 minutes; no goal needed    Time 4    Period Weeks    Status Deferred      PT LONG TERM GOAL #4   Title Pt/family will verbalize understanding of fall prevention in the home environment.    Time 4    Period Weeks    Status New                 Plan - 02/14/20 1828    Clinical Impression Statement Reviewed HEP this visit, with pt return demo understanding.  PT was going to progress HEP to include walking program for exercise along with OTAGO exercises; however, today pt's HR remains elevated after activity, with SOB on exertion (O2 sats 98%).  HR is 137 with walking and at rest it goes down to 113.  Did not progress HEP today for this reason; daugther and pt report she has not had her medication today.  Encouraged them to follow up with MD if any questions about medication.    Personal Factors and Comorbidities Transportation;Comorbidity 2    Comorbidities Hx of headaches, posterior fossa tumor s/p resection    Examination-Activity Limitations Caring for Others;Locomotion Level    Examination-Participation Restrictions Community Activity;Meal Prep    Stability/Clinical Decision Making Stable/Uncomplicated    Rehab Potential Good    PT Frequency 1x / week    PT Duration 4 weeks   plus eval   PT Treatment/Interventions ADLs/Self Care Home Management;Gait training;Stair training;Functional mobility training;Therapeutic activities;Therapeutic exercise;Balance training;Neuromuscular re-education;Patient/family education    PT Next Visit Plan discuss walking program for HEP; progress exercises for strength/balance as needed (next visit is last visit-d/c versus renew?)  Need to  assess vitals    Consulted and Agree with Plan of Care Patient;Family member/caregiver    Family Member Consulted daughter           Patient will benefit from skilled therapeutic intervention in order to improve the following deficits and impairments:  Abnormal gait, Difficulty  walking, Decreased activity tolerance, Decreased endurance, Decreased balance, Decreased mobility, Decreased strength  Visit Diagnosis: Unsteadiness on feet  Other abnormalities of gait and mobility     Problem List Patient Active Problem List   Diagnosis Date Noted   Brain tumor ([place]) 12/17/2019    Feiga Nadel W. 02/14/2020, 6:31 PM  Frazier Butt., PT  [facility] [address] [place], [place], [address] Phone: [phone]   Fax:  [phone]  Name: Belinda Berg MRN: [id number] Date of Birth: 1955-07-29

## 2020-02-19 ENCOUNTER — Inpatient Hospital Stay: Payer: 59 | Attending: Neurological Surgery

## 2020-02-22 ENCOUNTER — Other Ambulatory Visit: Payer: Self-pay | Admitting: Radiation Therapy

## 2020-02-22 DIAGNOSIS — D329 Benign neoplasm of meninges, unspecified: Secondary | ICD-10-CM

## 2020-02-28 ENCOUNTER — Ambulatory Visit: Payer: Self-pay | Admitting: Physical Therapy

## 2020-03-06 ENCOUNTER — Ambulatory Visit: Payer: 59 | Attending: Internal Medicine | Admitting: Physical Therapy

## 2020-03-06 ENCOUNTER — Other Ambulatory Visit: Payer: 59

## 2020-03-06 ENCOUNTER — Other Ambulatory Visit: Payer: Self-pay

## 2020-03-06 VITALS — BP 111/81 | HR 110

## 2020-03-06 DIAGNOSIS — R2681 Unsteadiness on feet: Secondary | ICD-10-CM | POA: Diagnosis present

## 2020-03-06 DIAGNOSIS — M6281 Muscle weakness (generalized): Secondary | ICD-10-CM | POA: Diagnosis present

## 2020-03-06 NOTE — Therapy (Signed)
[facility] [address] [place], [place], [address] Phone: [phone]   Fax:  [phone]  Physical Therapy Treatment/Discharge Summary   Patient Details  Name: Belinda Berg MRN: [id number] Date of Birth: [birth year] Referring Provider (PT): Benito Mccreedy, MD   Encounter Date: 03/06/2020   PT End of Session - 03/06/20 1840    Visit Number 4    Number of Visits 5    Authorization Type [employer]-20% of visit towards OOPM; 30 VL    PT Start Time 2426   pt arrives late   PT Stop Time 1744    PT Time Calculation (min) 35 min    Activity Tolerance Patient tolerated treatment well    Behavior During Therapy [facility] for tasks assessed/performed           Past Medical History:  Diagnosis Date  . Hypertension     Past Surgical History:  Procedure Laterality Date  . APPLICATION OF CRANIAL NAVIGATION N/A 12/20/2019   Procedure: APPLICATION OF CRANIAL NAVIGATION;  Surgeon: Judith Part, MD;  Location: [facility];  Service: Neurosurgery;  Laterality: N/A;  . CRANIOTOMY Right 12/20/2019   Procedure: CRANIOTOMY TUMOR RESECTION;  Surgeon: Judith Part, MD;  Location: [facility] [place];  Service: Neurosurgery;  Laterality: Right;    Vitals:   03/06/20 1717  BP: 111/81  Pulse: (!) 110  SpO2: 98%     Subjective Assessment - 03/06/20 1713    Subjective Daughter reports they saw the doctor and he changed up some of her medications.  She has had no falls; she reports doing well.    Patient is accompained by: Family member   Daughter   Patient Stated Goals Wants to regain her strength to do her daily activities.    Currently in Pain? No/denies              [facility] PT Assessment - 03/06/20 0001      Dynamic Gait Index   Level Surface Normal    Change in Gait Speed Normal    Gait with Horizontal Head Turns Normal    Gait with Vertical Head Turns Normal    Gait and Pivot Turn Normal    Step Over Obstacle Mild Impairment     Step Around Obstacles Normal    Steps Mild Impairment    Total Score 22                         [facility] Adult PT Treatment/Exercise - 03/06/20 0001      Ambulation/Gait   Ambulation/Gait Yes    Ambulation/Gait Assistance 6: Modified independent (Device/Increase time)    Ambulation Distance (Feet) 460 Feet    Assistive device None    Gait Pattern Step-through pattern;Wide base of support    Ambulation Surface Level;Indoor      Self-Care   Self-Care Other Self-Care Comments    Other Self-Care Comments  Fall prevention education provided-discussed fall prevention in home and daughter reports they have made changes and are aware of fall prevention from [place].   Discussed ways to increase overall activity level, including participation in short bouts of light household tasks.  Discussed progress towards goals and POC; pt is to have radiation beginning soon and is agreeable to d/c from PT at this time.               Balance Exercises - 03/06/20 0001      Balance Exercises: Standing   Other Standing Exercises  Comments Performed marching in place with 1 UE support at counter x 10 reps, functional squats at counter, x 10 reps.      OTAGO PROGRAM   Knee Extensor --    Knee Flexor 10 reps    Hip ABductor 10 reps    Ankle Plantorflexors --   10 reps   Ankle Dorsiflexors --   10 reps   Backwards Walking Support    Tandem Stance 10 seconds, support    Tandem Walk Support    One Leg Stand 10 seconds, support    Overall OTAGO Comments Reviewed HEP (in her OTAGO folder) and pt return demo understanding.  She appears to have slowed pace and lessened UE support with some of the exercises.             PT Education - 03/06/20 1839    Education Details fall prevention, progress towards goals, and plans for d/c this visit    Person(s) Educated Patient;Child(ren)   daughter   Methods Explanation    Comprehension Verbalized understanding               PT Long Term  Goals - 03/06/20 1841      PT LONG TERM GOAL #1   Title Pt will be independent with HEP for improved strength, balance, and overall endurance for return to PLOF.  TARGET:  02/29/2020    Time 4    Period Weeks    Status Achieved      PT LONG TERM GOAL #2   Title Pt will improve DGI to at least 21/24 for decreased fall risk.    Baseline 22/24 03/06/2020    Time 4    Period Weeks    Status Achieved      PT LONG TERM GOAL #3   Title 3 MWT to be assessed, with goal to be written as appropriate.    Baseline 780 ft in 3 minutes; no goal needed    Time 4    Period Weeks    Status Deferred      PT LONG TERM GOAL #4   Title Pt/family will verbalize understanding of fall prevention in the home environment.    Time 4    Period Weeks    Status Achieved                 Plan - 03/06/20 1842    Clinical Impression Statement Pt's HR today at rest still elevated at 110 bpm, but pt does not appear SOB during activities today (daughter reports they have followed up with MD and he changed some medications).  Assessed LTGs, and pt has met 3 of 3 LTGs (LTG 1, 2 and 4).  LTG 3 was deferred as no goal was needed for 3 minute walk test.  Pt and duaghter note improvement in mobility and stamina and pt has had no falls; she is appropriate for d/c from PT at this time.    Personal Factors and Comorbidities Transportation;Comorbidity 2    Comorbidities Hx of headaches, posterior fossa tumor s/p resection    Examination-Activity Limitations Caring for Others;Locomotion Level    Examination-Participation Restrictions Community Activity;Meal Prep    Stability/Clinical Decision Making Stable/Uncomplicated    Rehab Potential Good    PT Frequency 1x / week    PT Duration 4 weeks   plus eval   PT Treatment/Interventions ADLs/Self Care Home Management;Gait training;Stair training;Functional mobility training;Therapeutic activities;Therapeutic exercise;Balance training;Neuromuscular re-education;Patient/family  education    PT Next Visit Plan Discharge this visit  Consulted and Agree with Plan of Care Patient;Family member/caregiver    Family Member Consulted daughter           Patient will benefit from skilled therapeutic intervention in order to improve the following deficits and impairments:  Abnormal gait, Difficulty walking, Decreased activity tolerance, Decreased endurance, Decreased balance, Decreased mobility, Decreased strength  Visit Diagnosis: Unsteadiness on feet  Muscle weakness (generalized)     Problem List Patient Active Problem List   Diagnosis Date Noted  . Brain tumor ([place]) 12/17/2019    MARRIOTT,AMY W. 03/06/2020, 6:45 PM  Frazier Butt., PT   [facility] [address] [place], [place], [address] Phone: [phone]   Fax:  [phone]  Name: Kenniya Westrich MRN: [id number] Date of Birth: [birth year]   PHYSICAL THERAPY DISCHARGE SUMMARY  Visits from Start of Care: 4  Current functional level related to goals / functional outcomes:  PT Long Term Goals - 03/06/20 1841      PT LONG TERM GOAL #1   Title Pt will be independent with HEP for improved strength, balance, and overall endurance for return to PLOF.  TARGET:  02/29/2020    Time 4    Period Weeks    Status Achieved      PT LONG TERM GOAL #2   Title Pt will improve DGI to at least 21/24 for decreased fall risk.    Baseline 22/24 03/06/2020    Time 4    Period Weeks    Status Achieved      PT LONG TERM GOAL #3   Title 3 MWT to be assessed, with goal to be written as appropriate.    Baseline 780 ft in 3 minutes; no goal needed    Time 4    Period Weeks    Status Deferred      PT LONG TERM GOAL #4   Title Pt/family will verbalize understanding of fall prevention in the home environment.    Time 4    Period Weeks    Status Achieved          Pt has met 3 of 3 LTGs.   Remaining deficits: Decreased endurance (improving per  report)   Education / Equipment: Educated in [employer] and fall prevention  Plan: Patient agrees to discharge.  Patient goals were met. Patient is being discharged due to meeting the stated rehab goals.  ?????     Mady Haagensen, PT 03/06/20 6:46 PM Phone: [phone] Fax: [phone]

## 2020-03-07 ENCOUNTER — Ambulatory Visit
Admission: RE | Admit: 2020-03-07 | Discharge: 2020-03-07 | Disposition: A | Discharge status: Home / Self Care | Payer: 59 | Source: Ambulatory Visit | Attending: Radiation Oncology | Admitting: Radiation Oncology

## 2020-03-07 DIAGNOSIS — D329 Benign neoplasm of meninges, unspecified: Secondary | ICD-10-CM

## 2020-03-07 DIAGNOSIS — D320 Benign neoplasm of cerebral meninges: Secondary | ICD-10-CM | POA: Insufficient documentation

## 2020-03-07 DIAGNOSIS — Z510 Encounter for antineoplastic radiation therapy: Secondary | ICD-10-CM | POA: Diagnosis not present

## 2020-03-07 LAB — BUN & CREATININE (CHCC)
BUN: 12 mg/dL (ref 8–23)
Creatinine: 0.79 mg/dL (ref 0.44–1.00)
GFR, Est AFR Am: >60 mL/min (ref >60)
GFR, Estimated: >60 mL/min (ref >60)

## 2020-03-07 NOTE — Progress Notes (Signed)
Location/Histology of Brain Tumor:  FINAL MICROSCOPIC DIAGNOSIS:  A. BRAIN TUMOR, RIGHT CEREBELLAR, EXCISION:  - Meningioma, WHO grade 1  - See comment  COMMENT:  By immunohistochemistry, the neoplastic cells show a blush positivity  for EMA but are negative for S100 and GFAP.  Patient presented with symptoms of:   12/18/2019 Presented to [facility] emergency department for a 10/10 headache. A CT head was performed at [facility] and showed a large calcified mass in the right cerebellar hemisphere with compression of the 4th ventricle and obstructive hydrocephalus which subsequently lead to a neurosurgical evaluation. About one week ago the patient was walking and lost her balance which caused her to stumble but not fall. About two days ago, she began complaining of a severe headache as well as left hip pain. The headache pain continued to increase in severity with a 10/10 pain which prompted a call to her PCP. The PCP instructed the patient to go to the ED and have a head CT. Patient reported having headaches for the last five years but not as severe as the one that prompted her to go to ED.  Past or anticipated interventions, if any, per neurosurgery:  12/20/2019 Dr. Emelda Brothers -CRANIOTOMY TUMOR RESECTION (Right) -APPLICATION OF CRANIAL NAVIGATION  Post-op MRI showed good debulking of the tumor with a small sinus filling defect. Her hospital course was uncomplicated and the patient was discharged home on 12/22/19. She will follow up in clinic with me in 2 weeks.  Past or anticipated interventions, if any, per medical oncology: N/A  Dose of Decadron, if applicable: Not taking currently  Recent neurologic symptoms, if any:   Seizures: No  Headaches: Persistent before surgery, but they have resolved since  Nausea: No  Dizziness/ataxia: No  Difficulty with hand coordination: No  Focal numbness/weakness: No  Visual deficits/changes: Complaining of blurry vision  since surgery  Confusion/Memory deficits: No  Painful bone metastases at present, if any: None other than left hip (but last scan showed no fracture or metastasis)   SAFETY ISSUES:  Prior radiation? No  Pacemaker/ICD? No  Possible current pregnancy? No  Is the patient on methotrexate? No  Additional Complaints / other details:  The patient is non-English speaking; She is from [place] and speaks [place] ([place] dialect)

## 2020-03-08 ENCOUNTER — Encounter: Payer: Self-pay | Admitting: Radiation Oncology

## 2020-03-08 ENCOUNTER — Telehealth: Payer: Self-pay

## 2020-03-08 ENCOUNTER — Ambulatory Visit: Payer: 59

## 2020-03-08 ENCOUNTER — Ambulatory Visit
Admission: RE | Admit: 2020-03-08 | Discharge: 2020-03-08 | Disposition: A | Discharge status: Home / Self Care | Payer: 59 | Source: Ambulatory Visit | Attending: Radiation Oncology | Admitting: Radiation Oncology

## 2020-03-08 ENCOUNTER — Other Ambulatory Visit: Payer: Self-pay

## 2020-03-08 ENCOUNTER — Ambulatory Visit: Payer: 59 | Admitting: Radiation Oncology

## 2020-03-08 DIAGNOSIS — D320 Benign neoplasm of cerebral meninges: Secondary | ICD-10-CM | POA: Insufficient documentation

## 2020-03-08 DIAGNOSIS — D329 Benign neoplasm of meninges, unspecified: Secondary | ICD-10-CM

## 2020-03-08 NOTE — Telephone Encounter (Signed)
Spoke with patient daughter Minette Brine and advised per Dr Isidore Moos that she will be giving her a call a little later than her scheduled telephone visit time. Dr Isidore Moos is running about 25 minutes behind schedule. Daughter verbalized understanding of information given. TM

## 2020-03-08 NOTE — Progress Notes (Signed)
Radiation Oncology         ([phone]) [phone] ________________________________  Initial outpatient Consultation by telephone.  The patient opted for telemedicine to maximize safety during the pandemic.  MyChart video was not obtainable.   Name: Belinda Berg MRN: [id number]  Date: 03/08/2020  DOB: [birth year]  [id number], Belinda Beard, MD  Judith Part, MD   REFERRING PHYSICIAN: Judith Part, MD  DIAGNOSIS:    ICD-10-CM   1. Meningioma of cerebellum ([place])  D32.0     CHIEF COMPLAINT: Here to discuss management of meningioma   HISTORY OF PRESENT ILLNESS:Belinda Berg is a 64 y.o. female who presented with a worsening headache. She was referred to the ED on 12/17/2019. Head CT was performed at that time revealing: 3.8 cm partially-calcified right cerebellar mass, with mass effect and compression of fourth ventricle; no clear bony changes adjacent to the right cerebral mass.  Brain MRI was performed the following day showing: 4.7 cm right posterior fossa extra-axial mass with calcification likely reflecting a meningioma; compression and potential invasion of the distal right transverse sinus without occlusion; associated tonsillar herniation, ascending transtentorial herniation, and distortion of the brainstem.  She proceeded to craniotomy on 12/20/2019. Pathology from the procedure revealed: meningioma, WHO grade 1. Postoperative brain MRI performed on 12/22/2019 showed: postsurgical changes from debulking of large right-sided posterior fossa mass with residual tumor along the lateral and superior margins of the surgical cavity; right occipital parietal scalp fluid collection which appear in continuity with the surgical cavity.  Her case was discussed at the multidisciplinary tumor board conference on 01/29/2020.  Consensus was that she should receive postoperative radiation with fractionated radiosurgery.  Follow up head CT performed on 02/08/2020 showed: debulking the large calcified right  lateral posterior fossa meningioma; residual tumor along the lateral extent; reduction in width of the tumor from 3.2 cm to 2.0 cm; less mass effect upon the cerebellum and fourth ventricle; reduction in size of the lateral and third ventricles.  She is scheduled for repeat brain MRI on 03/13/2020. She denies healing issues at scalp incision; she has occasioanal pain there. No HAs. No previous seizures. Usually her daughter Belinda Berg drives her to her appointments.  She is not claustrophobic.  She reports her strength and functionality have diminished since diagnosis/surgery, PT has has helped.  She has been vaccinated for COVID 19.  PREVIOUS RADIATION THERAPY: No  PAST MEDICAL HISTORY:  has a past medical history of Hypertension.    PAST SURGICAL HISTORY: Past Surgical History:  Procedure Laterality Date  . APPLICATION OF CRANIAL NAVIGATION N/A 12/20/2019   Procedure: APPLICATION OF CRANIAL NAVIGATION;  Surgeon: Judith Part, MD;  Location: [facility];  Service: Neurosurgery;  Laterality: N/A;  . CRANIOTOMY Right 12/20/2019   Procedure: CRANIOTOMY TUMOR RESECTION;  Surgeon: Judith Part, MD;  Location: [facility];  Service: Neurosurgery;  Laterality: Right;    FAMILY HISTORY: family history is not on file.  SOCIAL HISTORY:  reports that she has never smoked. She has never used smokeless tobacco. She reports that she does not drink alcohol and does not use drugs.  ALLERGIES: Patient has no known allergies.  MEDICATIONS:  Current Outpatient Medications  Medication Sig Dispense Refill  . amLODipine-olmesartan (AZOR) 5-20 MG tablet Take 1 tablet by mouth daily.    . diphenhydrAMINE (BENADRYL) 25 MG tablet Take 25 mg by mouth every 6 (six) hours as needed for itching (Takes to manage itching from amlodipine prescription).    . hydrochlorothiazide (HYDRODIURIL) 25 MG tablet Take 25  mg by mouth daily.    Marland Kitchen losartan (COZAAR) 50 MG tablet Take 50 mg by mouth daily.    . Multiple Vitamin  (MULTIVITAMIN WITH MINERALS) TABS tablet Take 1 tablet by mouth daily.     No current facility-administered medications for this encounter.    REVIEW OF SYSTEMS:  Notable for that above.   PHYSICAL EXAM:  vitals were not taken for this visit.      LABORATORY DATA:  Lab Results  Component Value Date   WBC 13.2 (H) 12/18/2019   HGB 9.2 (L) 12/20/2019   HCT 27.0 (L) 12/20/2019   MCV 77.1 (L) 12/18/2019   PLT 297 12/18/2019   CMP     Component Value Date/Time   NA 144 12/20/2019 1941   K 4.1 12/20/2019 1941   CL 107 12/18/2019 0622   CO2 23 12/18/2019 0622   GLUCOSE 162 (H) 12/18/2019 0622   BUN 12 03/07/2020 1623   CREATININE 0.79 03/07/2020 1623   CALCIUM 9.2 12/18/2019 0622   PROT 8.1 12/18/2019 0622   ALBUMIN 3.4 (L) 12/18/2019 0622   AST 21 12/18/2019 0622   ALT 20 12/18/2019 0622   ALKPHOS 127 (H) 12/18/2019 0622   BILITOT 0.6 12/18/2019 0622   GFRNONAA >60 03/07/2020 1623   GFRAA >60 03/07/2020 1623         RADIOGRAPHY: CT HEAD WO CONTRAST  Result Date: 02/09/2020 CLINICAL DATA:  Surgery 12/21/2019 for posterior fossa mass. Question hydrocephalus. EXAM: CT HEAD WITHOUT CONTRAST TECHNIQUE: Contiguous axial images were obtained from the base of the skull through the vertex without intravenous contrast. COMPARISON:  12/22/2019.  12/17/2019. FINDINGS: Brain: Previous right occipital craniectomy and cranioplasty for debulking of a right lateral posterior fossa calcified meningioma. Previously the tumor measured 3.6-3.8 cm in diameter. Today, residual tumor measures 3.3 x 2.0 cm. Less mass effect upon the cerebellum and fourth ventricle. Mild edema adjacent to the tumor. Decrease in size of the lateral and third ventricles. Frontal horn of the left lateral ventricle is decreased in diameter from 3 cm to 2.2 cm. Third ventricle is reduced from a right to left diameter of 17 mm to 13 mm. No evidence of hemorrhage. No extra-axial collection. Vascular: There is atherosclerotic  calcification of the major vessels at the base of the brain. Skull: Otherwise negative Sinuses/Orbits: Clear/normal Other: None IMPRESSION: Debulking the large calcified right lateral posterior fossa meningioma. Residual tumor along the lateral extent. Reduction in width of the tumor from 3.2 cm to 2.0 cm. Less mass effect upon the cerebellum and fourth ventricle. Reduction in size of the lateral and third ventricles as measured above. Electronically Signed   By: Nelson Chimes M.D.   On: 02/09/2020 12:59      IMPRESSION/PLAN: Meningioma   This is a lovely 64 year old woman with a history of a large right cerebellar meningioma.  I have personally reviewed her imaging.  Her daughter Belinda Berg is present today on the phone serving as a Optometrist.  I spoke with the patient and her daughter about the risks benefits and side effects of postoperative radiation.  I explained my rationale for recommending a 5 fraction course of radiosurgery to the region of likely residual disease in the brain.  The patient and her daughter enthusiastic about proceeding.  They understand that another MRI will need to be conducted for treatment planning and that next week we also have her scheduled for treatment planning session which will involve getting a mask fabricated for immobilization during treatment.  CT scan  will be obtained after the mask is fabricated.  Treatments will be delivered every other day and may be associated with headache, fatigue, skin irritation, hair loss, potential injury to the brain.  We discussed the technology and methods that we used to minimize the risk of side effects.  They are enthusiastic about proceeding and a consent form will be signed when she comes to our clinic for treatment planning.  All questions were answered to the satisfaction of the patient and her daughter.   We discussed measures to reduce the risk of infection during the COVID-19 pandemic.  The patient reports she has been  vaccinated.  On date of service, in total, I spent 45 minutes on this encounter.  This encounter was provided by telemedicine platform by telephone.  The patient opted for telemedicine to maximize safety during the pandemic.  MyChart video was not obtainable. The patient has given verbal consent for this type of encounter and has been advised to only accept a meeting of this type in a secure network environment. The attendants for this meeting include Belinda Berg  and Belinda Berg.  Her daughter Belinda Berg was present too. During the encounter, Belinda Berg was located at [facility] Radiation Oncology Department.  Belinda Berg was located at home.  __________________________________________   Belinda Gibson, MD  This document serves as a record of services personally performed by Belinda Gibson, MD. It was created on her behalf by Wilburn Mylar, a trained medical scribe. The creation of this record is based on the scribe's personal observations and the provider's statements to them. This document has been checked and approved by the attending provider.

## 2020-03-12 ENCOUNTER — Ambulatory Visit
Admission: RE | Admit: 2020-03-12 | Discharge: 2020-03-12 | Disposition: A | Discharge status: Home / Self Care | Payer: 59 | Source: Ambulatory Visit | Attending: Radiation Oncology | Admitting: Radiation Oncology

## 2020-03-12 ENCOUNTER — Other Ambulatory Visit: Payer: Self-pay

## 2020-03-12 ENCOUNTER — Ambulatory Visit: Payer: 59 | Admitting: Radiation Oncology

## 2020-03-12 ENCOUNTER — Ambulatory Visit: Payer: 59

## 2020-03-12 VITALS — BP 144/98 | HR 122 | Temp 98.6°F | Resp 18 | Wt 167.2 lb

## 2020-03-12 DIAGNOSIS — D320 Benign neoplasm of cerebral meninges: Secondary | ICD-10-CM

## 2020-03-12 DIAGNOSIS — Z510 Encounter for antineoplastic radiation therapy: Secondary | ICD-10-CM | POA: Diagnosis not present

## 2020-03-12 NOTE — Progress Notes (Signed)
Has armband been applied?  Yes.    Does patient have an allergy to IV contrast dye?: No.   Has patient ever received premedication for IV contrast dye?: No.   Does patient take metformin?: No.  Date of lab work: March 07, 2020 BUN: 12 CR: 0.79  IV site: antecubital left, condition patent and no redness  Has IV site been added to flowsheet?  Yes.    Vitals:   03/12/20 1051  BP: (!) 144/98  Pulse: (!) 122  Resp: 18  Temp: 98.6 F (37 C)  SpO2: 100%

## 2020-03-13 ENCOUNTER — Ambulatory Visit: Payer: 59

## 2020-03-13 ENCOUNTER — Ambulatory Visit: Payer: 59 | Admitting: Radiation Oncology

## 2020-03-13 ENCOUNTER — Ambulatory Visit
Admission: RE | Admit: 2020-03-13 | Discharge: 2020-03-13 | Disposition: A | Discharge status: Home / Self Care | Payer: 59 | Source: Ambulatory Visit | Attending: Radiation Oncology | Admitting: Radiation Oncology

## 2020-03-13 DIAGNOSIS — D329 Benign neoplasm of meninges, unspecified: Secondary | ICD-10-CM

## 2020-03-13 MED ORDER — GADOBENATE DIMEGLUMINE 529 MG/ML IV SOLN
20.0000 mL | Freq: Once | INTRAVENOUS | Status: AC | PRN
  Administered 2020-03-13: 20 mL via INTRAVENOUS

## 2020-03-15 DIAGNOSIS — Z510 Encounter for antineoplastic radiation therapy: Secondary | ICD-10-CM | POA: Diagnosis not present

## 2020-03-18 ENCOUNTER — Other Ambulatory Visit: Payer: Self-pay

## 2020-03-18 ENCOUNTER — Ambulatory Visit
Admission: RE | Admit: 2020-03-18 | Discharge: 2020-03-18 | Disposition: A | Discharge status: Home / Self Care | Payer: 59 | Source: Ambulatory Visit | Attending: Radiation Oncology | Admitting: Radiation Oncology

## 2020-03-18 ENCOUNTER — Ambulatory Visit: Payer: 59 | Admitting: Radiation Oncology

## 2020-03-18 DIAGNOSIS — Z510 Encounter for antineoplastic radiation therapy: Secondary | ICD-10-CM | POA: Diagnosis not present

## 2020-03-18 NOTE — Progress Notes (Signed)
Pt here for patient teaching.  Education reviewed through the help of tele-interpreter   Pt given Radiation and You booklet and skin care instructions.    Reviewed areas of pertinence such as fatigue, hair loss, nausea and vomiting, skin changes, headache and blurry vision .   Pt able to give teach back of to pat skin, use unscented/gentle soap and drink plenty of water,to use an electric razor if they must shave.   Pt demonstrated understanding and verbalizes understanding of information given and will contact nursing with any questions or concerns.    [email or web address]/treatmentinformation/whattoexpect/index

## 2020-03-19 ENCOUNTER — Other Ambulatory Visit: Payer: Self-pay

## 2020-03-19 ENCOUNTER — Ambulatory Visit
Admission: RE | Admit: 2020-03-19 | Discharge: 2020-03-19 | Disposition: A | Discharge status: Home / Self Care | Payer: 59 | Source: Ambulatory Visit | Attending: Radiation Oncology | Admitting: Radiation Oncology

## 2020-03-19 DIAGNOSIS — Z510 Encounter for antineoplastic radiation therapy: Secondary | ICD-10-CM | POA: Diagnosis not present

## 2020-03-20 ENCOUNTER — Ambulatory Visit: Payer: 59 | Admitting: Radiation Oncology

## 2020-03-20 ENCOUNTER — Other Ambulatory Visit: Payer: Self-pay

## 2020-03-20 ENCOUNTER — Ambulatory Visit
Admission: RE | Admit: 2020-03-20 | Discharge: 2020-03-20 | Disposition: A | Discharge status: Home / Self Care | Payer: 59 | Source: Ambulatory Visit | Attending: Radiation Oncology | Admitting: Radiation Oncology

## 2020-03-20 DIAGNOSIS — D320 Benign neoplasm of cerebral meninges: Secondary | ICD-10-CM | POA: Diagnosis present

## 2020-03-20 DIAGNOSIS — Z510 Encounter for antineoplastic radiation therapy: Secondary | ICD-10-CM | POA: Diagnosis present

## 2020-03-21 ENCOUNTER — Ambulatory Visit
Admission: RE | Admit: 2020-03-21 | Discharge: 2020-03-21 | Disposition: A | Discharge status: Home / Self Care | Payer: 59 | Source: Ambulatory Visit | Attending: Radiation Oncology | Admitting: Radiation Oncology

## 2020-03-21 DIAGNOSIS — Z510 Encounter for antineoplastic radiation therapy: Secondary | ICD-10-CM | POA: Diagnosis not present

## 2020-03-22 ENCOUNTER — Ambulatory Visit
Admission: RE | Admit: 2020-03-22 | Discharge: 2020-03-22 | Disposition: A | Discharge status: Home / Self Care | Payer: 59 | Source: Ambulatory Visit | Attending: Radiation Oncology | Admitting: Radiation Oncology

## 2020-03-22 ENCOUNTER — Ambulatory Visit: Payer: 59

## 2020-03-22 DIAGNOSIS — Z510 Encounter for antineoplastic radiation therapy: Secondary | ICD-10-CM | POA: Diagnosis not present

## 2020-03-26 ENCOUNTER — Ambulatory Visit: Payer: 59

## 2020-03-26 ENCOUNTER — Other Ambulatory Visit: Payer: Self-pay

## 2020-03-26 ENCOUNTER — Ambulatory Visit
Admission: RE | Admit: 2020-03-26 | Discharge: 2020-03-26 | Disposition: A | Discharge status: Home / Self Care | Payer: 59 | Source: Ambulatory Visit | Attending: Radiation Oncology | Admitting: Radiation Oncology

## 2020-03-26 DIAGNOSIS — Z510 Encounter for antineoplastic radiation therapy: Secondary | ICD-10-CM | POA: Diagnosis not present

## 2020-03-27 ENCOUNTER — Other Ambulatory Visit: Payer: Self-pay

## 2020-03-27 ENCOUNTER — Ambulatory Visit
Admission: RE | Admit: 2020-03-27 | Discharge: 2020-03-27 | Disposition: A | Discharge status: Home / Self Care | Payer: 59 | Source: Ambulatory Visit | Attending: Radiation Oncology | Admitting: Radiation Oncology

## 2020-03-27 DIAGNOSIS — Z510 Encounter for antineoplastic radiation therapy: Secondary | ICD-10-CM | POA: Diagnosis not present

## 2020-03-28 ENCOUNTER — Other Ambulatory Visit: Payer: Self-pay

## 2020-03-28 ENCOUNTER — Ambulatory Visit
Admission: RE | Admit: 2020-03-28 | Discharge: 2020-03-28 | Disposition: A | Discharge status: Home / Self Care | Payer: 59 | Source: Ambulatory Visit | Attending: Radiation Oncology | Admitting: Radiation Oncology

## 2020-03-28 DIAGNOSIS — Z510 Encounter for antineoplastic radiation therapy: Secondary | ICD-10-CM | POA: Diagnosis not present

## 2020-03-29 ENCOUNTER — Ambulatory Visit
Admission: RE | Admit: 2020-03-29 | Discharge: 2020-03-29 | Disposition: A | Discharge status: Home / Self Care | Payer: 59 | Source: Ambulatory Visit | Attending: Radiation Oncology | Admitting: Radiation Oncology

## 2020-03-29 ENCOUNTER — Other Ambulatory Visit: Payer: Self-pay

## 2020-03-29 ENCOUNTER — Ambulatory Visit: Payer: 59

## 2020-03-29 DIAGNOSIS — Z510 Encounter for antineoplastic radiation therapy: Secondary | ICD-10-CM | POA: Diagnosis not present

## 2020-04-01 ENCOUNTER — Other Ambulatory Visit: Payer: Self-pay

## 2020-04-01 ENCOUNTER — Other Ambulatory Visit: Payer: Self-pay | Admitting: Radiation Oncology

## 2020-04-01 ENCOUNTER — Ambulatory Visit
Admission: RE | Admit: 2020-04-01 | Discharge: 2020-04-01 | Disposition: A | Discharge status: Home / Self Care | Payer: 59 | Source: Ambulatory Visit | Attending: Radiation Oncology | Admitting: Radiation Oncology

## 2020-04-01 DIAGNOSIS — Z510 Encounter for antineoplastic radiation therapy: Secondary | ICD-10-CM | POA: Diagnosis not present

## 2020-04-01 DIAGNOSIS — D320 Benign neoplasm of cerebral meninges: Secondary | ICD-10-CM

## 2020-04-01 MED ORDER — ONDANSETRON 8 MG PO TBDP: 8.0000 mg | ORAL_TABLET | Freq: Three times a day (TID) | ORAL | 5 refills | Status: DC | PRN

## 2020-04-02 ENCOUNTER — Ambulatory Visit
Admission: RE | Admit: 2020-04-02 | Discharge: 2020-04-02 | Disposition: A | Discharge status: Home / Self Care | Payer: 59 | Source: Ambulatory Visit | Attending: Radiation Oncology | Admitting: Radiation Oncology

## 2020-04-02 DIAGNOSIS — Z510 Encounter for antineoplastic radiation therapy: Secondary | ICD-10-CM | POA: Diagnosis not present

## 2020-04-03 ENCOUNTER — Ambulatory Visit
Admission: RE | Admit: 2020-04-03 | Discharge: 2020-04-03 | Disposition: A | Discharge status: Home / Self Care | Payer: 59 | Source: Ambulatory Visit | Attending: Radiation Oncology | Admitting: Radiation Oncology

## 2020-04-03 ENCOUNTER — Other Ambulatory Visit: Payer: Self-pay

## 2020-04-03 DIAGNOSIS — Z510 Encounter for antineoplastic radiation therapy: Secondary | ICD-10-CM | POA: Diagnosis not present

## 2020-04-04 ENCOUNTER — Other Ambulatory Visit: Payer: Self-pay

## 2020-04-04 ENCOUNTER — Ambulatory Visit
Admission: RE | Admit: 2020-04-04 | Discharge: 2020-04-04 | Disposition: A | Discharge status: Home / Self Care | Payer: 59 | Source: Ambulatory Visit | Attending: Radiation Oncology | Admitting: Radiation Oncology

## 2020-04-04 DIAGNOSIS — Z510 Encounter for antineoplastic radiation therapy: Secondary | ICD-10-CM | POA: Diagnosis not present

## 2020-04-05 ENCOUNTER — Ambulatory Visit
Admission: RE | Admit: 2020-04-05 | Discharge: 2020-04-05 | Disposition: A | Discharge status: Home / Self Care | Payer: 59 | Source: Ambulatory Visit | Attending: Radiation Oncology | Admitting: Radiation Oncology

## 2020-04-05 ENCOUNTER — Other Ambulatory Visit: Payer: Self-pay

## 2020-04-05 DIAGNOSIS — Z510 Encounter for antineoplastic radiation therapy: Secondary | ICD-10-CM | POA: Diagnosis not present

## 2020-04-08 ENCOUNTER — Other Ambulatory Visit: Payer: Self-pay

## 2020-04-08 ENCOUNTER — Ambulatory Visit
Admission: RE | Admit: 2020-04-08 | Discharge: 2020-04-08 | Disposition: A | Discharge status: Home / Self Care | Payer: 59 | Source: Ambulatory Visit | Attending: Radiation Oncology | Admitting: Radiation Oncology

## 2020-04-08 DIAGNOSIS — Z510 Encounter for antineoplastic radiation therapy: Secondary | ICD-10-CM | POA: Diagnosis not present

## 2020-04-09 ENCOUNTER — Ambulatory Visit
Admission: RE | Admit: 2020-04-09 | Discharge: 2020-04-09 | Disposition: A | Discharge status: Home / Self Care | Payer: 59 | Source: Ambulatory Visit | Attending: Radiation Oncology | Admitting: Radiation Oncology

## 2020-04-09 DIAGNOSIS — Z510 Encounter for antineoplastic radiation therapy: Secondary | ICD-10-CM | POA: Diagnosis not present

## 2020-04-10 ENCOUNTER — Other Ambulatory Visit: Payer: Self-pay

## 2020-04-10 ENCOUNTER — Ambulatory Visit
Admission: RE | Admit: 2020-04-10 | Discharge: 2020-04-10 | Disposition: A | Discharge status: Home / Self Care | Payer: 59 | Source: Ambulatory Visit | Attending: Radiation Oncology | Admitting: Radiation Oncology

## 2020-04-10 DIAGNOSIS — Z510 Encounter for antineoplastic radiation therapy: Secondary | ICD-10-CM | POA: Diagnosis not present

## 2020-04-11 ENCOUNTER — Ambulatory Visit
Admission: RE | Admit: 2020-04-11 | Discharge: 2020-04-11 | Disposition: A | Discharge status: Home / Self Care | Payer: 59 | Source: Ambulatory Visit | Attending: Radiation Oncology | Admitting: Radiation Oncology

## 2020-04-11 DIAGNOSIS — Z510 Encounter for antineoplastic radiation therapy: Secondary | ICD-10-CM | POA: Diagnosis not present

## 2020-04-12 ENCOUNTER — Ambulatory Visit
Admission: RE | Admit: 2020-04-12 | Discharge: 2020-04-12 | Disposition: A | Discharge status: Home / Self Care | Payer: 59 | Source: Ambulatory Visit | Attending: Radiation Oncology | Admitting: Radiation Oncology

## 2020-04-12 ENCOUNTER — Other Ambulatory Visit: Payer: Self-pay

## 2020-04-12 DIAGNOSIS — Z510 Encounter for antineoplastic radiation therapy: Secondary | ICD-10-CM | POA: Diagnosis not present

## 2020-04-15 ENCOUNTER — Other Ambulatory Visit: Payer: Self-pay

## 2020-04-15 ENCOUNTER — Ambulatory Visit
Admission: RE | Admit: 2020-04-15 | Discharge: 2020-04-15 | Disposition: A | Discharge status: Home / Self Care | Payer: 59 | Source: Ambulatory Visit | Attending: Radiation Oncology | Admitting: Radiation Oncology

## 2020-04-15 DIAGNOSIS — Z510 Encounter for antineoplastic radiation therapy: Secondary | ICD-10-CM | POA: Diagnosis not present

## 2020-04-16 ENCOUNTER — Ambulatory Visit
Admission: RE | Admit: 2020-04-16 | Discharge: 2020-04-16 | Disposition: A | Discharge status: Home / Self Care | Payer: 59 | Source: Ambulatory Visit | Attending: Radiation Oncology | Admitting: Radiation Oncology

## 2020-04-16 DIAGNOSIS — Z510 Encounter for antineoplastic radiation therapy: Secondary | ICD-10-CM | POA: Diagnosis not present

## 2020-04-17 ENCOUNTER — Ambulatory Visit
Admission: RE | Admit: 2020-04-17 | Discharge: 2020-04-17 | Disposition: A | Discharge status: Home / Self Care | Payer: 59 | Source: Ambulatory Visit | Attending: Radiation Oncology | Admitting: Radiation Oncology

## 2020-04-17 DIAGNOSIS — Z510 Encounter for antineoplastic radiation therapy: Secondary | ICD-10-CM | POA: Diagnosis not present

## 2020-04-18 ENCOUNTER — Ambulatory Visit
Admission: RE | Admit: 2020-04-18 | Discharge: 2020-04-18 | Disposition: A | Discharge status: Home / Self Care | Payer: 59 | Source: Ambulatory Visit | Attending: Radiation Oncology | Admitting: Radiation Oncology

## 2020-04-18 DIAGNOSIS — Z510 Encounter for antineoplastic radiation therapy: Secondary | ICD-10-CM | POA: Diagnosis not present

## 2020-04-19 ENCOUNTER — Ambulatory Visit
Admission: RE | Admit: 2020-04-19 | Discharge: 2020-04-19 | Disposition: A | Discharge status: Home / Self Care | Payer: 59 | Source: Ambulatory Visit | Attending: Radiation Oncology | Admitting: Radiation Oncology

## 2020-04-19 DIAGNOSIS — D320 Benign neoplasm of cerebral meninges: Secondary | ICD-10-CM | POA: Insufficient documentation

## 2020-04-22 ENCOUNTER — Ambulatory Visit
Admission: RE | Admit: 2020-04-22 | Discharge: 2020-04-22 | Disposition: A | Discharge status: Home / Self Care | Payer: 59 | Source: Ambulatory Visit | Attending: Radiation Oncology | Admitting: Radiation Oncology

## 2020-04-22 DIAGNOSIS — D320 Benign neoplasm of cerebral meninges: Secondary | ICD-10-CM | POA: Diagnosis not present

## 2020-04-23 ENCOUNTER — Ambulatory Visit
Admission: RE | Admit: 2020-04-23 | Discharge: 2020-04-23 | Disposition: A | Discharge status: Home / Self Care | Payer: 59 | Source: Ambulatory Visit | Attending: Radiation Oncology | Admitting: Radiation Oncology

## 2020-04-23 DIAGNOSIS — D320 Benign neoplasm of cerebral meninges: Secondary | ICD-10-CM | POA: Diagnosis not present

## 2020-04-24 ENCOUNTER — Ambulatory Visit
Admission: RE | Admit: 2020-04-24 | Discharge: 2020-04-24 | Disposition: A | Discharge status: Home / Self Care | Payer: 59 | Source: Ambulatory Visit | Attending: Radiation Oncology | Admitting: Radiation Oncology

## 2020-04-24 DIAGNOSIS — D320 Benign neoplasm of cerebral meninges: Secondary | ICD-10-CM | POA: Diagnosis not present

## 2020-04-25 ENCOUNTER — Ambulatory Visit
Admission: RE | Admit: 2020-04-25 | Discharge: 2020-04-25 | Disposition: A | Discharge status: Home / Self Care | Payer: 59 | Source: Ambulatory Visit | Attending: Radiation Oncology | Admitting: Radiation Oncology

## 2020-04-25 DIAGNOSIS — D320 Benign neoplasm of cerebral meninges: Secondary | ICD-10-CM | POA: Diagnosis not present

## 2020-04-26 ENCOUNTER — Ambulatory Visit
Admission: RE | Admit: 2020-04-26 | Discharge: 2020-04-26 | Disposition: A | Discharge status: Home / Self Care | Payer: 59 | Source: Ambulatory Visit | Attending: Radiation Oncology | Admitting: Radiation Oncology

## 2020-04-26 DIAGNOSIS — D320 Benign neoplasm of cerebral meninges: Secondary | ICD-10-CM | POA: Diagnosis not present

## 2020-04-29 ENCOUNTER — Ambulatory Visit
Admission: RE | Admit: 2020-04-29 | Discharge: 2020-04-29 | Disposition: A | Discharge status: Home / Self Care | Payer: 59 | Source: Ambulatory Visit | Attending: Radiation Oncology | Admitting: Radiation Oncology

## 2020-04-29 DIAGNOSIS — D320 Benign neoplasm of cerebral meninges: Secondary | ICD-10-CM | POA: Diagnosis not present

## 2020-04-29 MED ORDER — SONAFINE EX EMUL
1.0000 "application " | Freq: Two times a day (BID) | CUTANEOUS | Status: DC
  Administered 2020-04-29: 1 via TOPICAL

## 2020-04-30 ENCOUNTER — Ambulatory Visit
Admission: RE | Admit: 2020-04-30 | Discharge: 2020-04-30 | Disposition: A | Discharge status: Home / Self Care | Payer: 59 | Source: Ambulatory Visit | Attending: Radiation Oncology | Admitting: Radiation Oncology

## 2020-04-30 ENCOUNTER — Encounter: Payer: Self-pay | Admitting: Radiation Oncology

## 2020-04-30 DIAGNOSIS — D320 Benign neoplasm of cerebral meninges: Secondary | ICD-10-CM | POA: Diagnosis not present

## 2020-05-30 NOTE — Progress Notes (Signed)
Belinda Berg presents today for follow-up after completing radiation to her brain/cerebellum on 04/30/2020  Headaches: Reports occasional, but not as bad as they were before her radiation treatment. Denies needing to take any OTC pain reliever to manage Vision: Reports blurry vision in her left eye Auditory: Denies any issues Balance: Patient denies any feelings of dizziness or being off balance Nausea: Patient denies and reports a stable appetite Wt Readings from Last 3 Encounters:  05/31/20 162 lb 12.8 oz (73.8 kg)  03/12/20 167 lb 4 oz (75.9 kg)  12/17/19 160 lb 7.9 oz (72.8 kg)   Memory: Denies any issues Decadron dose: None ordered Other issues of note: Overall she reports she feels well and is pleased with her recovery thus far. Her main concern today is the lingering tenderness/discomfort at her surgical incision behind her right ear.   Vitals:   05/31/20 1141  BP: (!) 148/69  Pulse: 73  Resp: 20  Temp: 97.7 F (36.5 C)  SpO2: 100%

## 2020-05-31 ENCOUNTER — Other Ambulatory Visit: Payer: Self-pay

## 2020-05-31 ENCOUNTER — Ambulatory Visit
Admission: RE | Admit: 2020-05-31 | Discharge: 2020-05-31 | Disposition: A | Discharge status: Home / Self Care | Payer: 59 | Source: Ambulatory Visit | Attending: Radiation Oncology | Admitting: Radiation Oncology

## 2020-05-31 VITALS — BP 148/69 | HR 73 | Temp 97.7°F | Resp 20 | Ht 60.0 in | Wt 162.8 lb

## 2020-05-31 DIAGNOSIS — Z79899 Other long term (current) drug therapy: Secondary | ICD-10-CM | POA: Insufficient documentation

## 2020-05-31 DIAGNOSIS — Z923 Personal history of irradiation: Secondary | ICD-10-CM | POA: Diagnosis not present

## 2020-05-31 DIAGNOSIS — D320 Benign neoplasm of cerebral meninges: Secondary | ICD-10-CM

## 2020-05-31 DIAGNOSIS — C700 Malignant neoplasm of cerebral meninges: Secondary | ICD-10-CM | POA: Insufficient documentation

## 2020-06-03 ENCOUNTER — Other Ambulatory Visit: Payer: Self-pay | Admitting: Radiation Therapy

## 2020-06-03 ENCOUNTER — Encounter: Payer: Self-pay | Admitting: Radiation Oncology

## 2020-06-03 DIAGNOSIS — D329 Benign neoplasm of meninges, unspecified: Secondary | ICD-10-CM

## 2020-06-03 NOTE — Progress Notes (Signed)
Radiation Oncology         (336) [phone] ________________________________  Name: Belinda Berg MRN: [id number]  Date: 05/31/2020  DOB: 08-Dec-1955  Follow-Up Visit Note  Outpatient  CC: Benito Mccreedy, MD  Benito Mccreedy, MD  Diagnosis and Prior Radiotherapy:    ICD-10-CM   1. 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   Belinda Berg presents today for follow-up after completing radiation to her brain/cerebellum on 04/30/2020  Headaches: Reports occasional, but not as bad as they were before her radiation treatment. Denies needing to take any OTC pain reliever to manage Vision: Reports blurry vision in her left eye Auditory: Denies any issues Balance: Patient denies any feelings of dizziness or being off balance Nausea: Patient denies and reports a stable appetite Wt Readings from Last 3 Encounters:  05/31/20 162 lb 12.8 oz (73.8 kg)  03/12/20 167 lb 4 oz (75.9 kg)  12/17/19 160 lb 7.9 oz (72.8 kg)   Memory: Denies any issues Decadron dose: None ordered Other issues of note: Overall she reports she feels well and is pleased with her recovery thus far. Her main concern today is the lingering tenderness/discomfort at her surgical incision behind her right ear.   Vitals:   05/31/20 1141  BP: (!) 148/69  Pulse: 73  Resp: 20  Temp: 97.7 F (36.5 C)  SpO2: 100%                                  ALLERGIES:  has No Known Allergies.  Meds: Current Outpatient Medications  Medication Sig Dispense Refill  . amLODipine-olmesartan (AZOR) 5-20 MG tablet Take 1 tablet by mouth daily.    . diphenhydrAMINE (BENADRYL) 25 MG tablet Take 25 mg by mouth every 6  (six) hours as needed for itching (Takes to manage itching from amlodipine prescription).    . hydrochlorothiazide (HYDRODIURIL) 25 MG tablet Take 25 mg by mouth daily.    Marland Kitchen losartan (COZAAR) 50 MG tablet Take 50 mg by mouth daily.    . Multiple Vitamin (MULTIVITAMIN WITH MINERALS) TABS tablet Take 1 tablet by mouth daily.    . ondansetron (ZOFRAN-ODT) 8 MG disintegrating tablet Take 1 tablet (8 mg total) by mouth every 8 (eight) hours as needed for nausea or vomiting. 18 tablet 5   No current facility-administered medications for this encounter.    Physical Findings: The patient is in no acute distress. Patient is alert and oriented.  height is 5' (1.524 m) and weight is 162 lb 12.8 oz (73.8 kg). Her temperature is 97.7 F (36.5 C). Her blood pressure is 148/69 (abnormal) and her pulse is 73. Her respiration is 20 and oxygen saturation is 100%. .     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    She is ambulatory, alert, oriented.   Lab Findings: Lab Results  Component Value Date   WBC 13.2 (H) 12/18/2019   HGB 9.2 (L) 12/20/2019   HCT 27.0 (L) 12/20/2019   MCV 77.1 (L) 12/18/2019   PLT 297 12/18/2019    Radiographic Findings: No results found.  Impression/Plan: This is a very pleasant  64 year old woman with history of right cerebellar meningioma.  It was incompletely resected.  Based on serial imaging it behaved more aggressively than the histology which indicated this to be WHO Grade 1.  Therefore, I created a radiation plan to treat this meningioma with larger fields and to a higher dose than typically used for Grade 1 tumors.  Her treatment plan was consistent with the level of aggressiveness that would be used for a Grade 2 or Grade 3 tumor.  She tolerated the treatment very well.    The patient is here alone today.  There are some language difficulties and interpreter is not available today.  We will call the patient's daughter and arrange restaging  MRI of the brain to take place approximately 6 months after completion of radiation therapy - she will follow up with neuro-oncology at that time.   On date of service, in total, I spent 10 minutes on this encounter. Patient was seen in person.  _____________________________________   Eppie Gibson, MD

## 2020-06-06 ENCOUNTER — Encounter (HOSPITAL_BASED_OUTPATIENT_CLINIC_OR_DEPARTMENT_OTHER): Payer: 59 | Attending: Internal Medicine | Admitting: Internal Medicine

## 2020-06-06 ENCOUNTER — Other Ambulatory Visit: Payer: Self-pay

## 2020-06-06 DIAGNOSIS — I10 Essential (primary) hypertension: Secondary | ICD-10-CM | POA: Insufficient documentation

## 2020-06-06 DIAGNOSIS — R21 Rash and other nonspecific skin eruption: Secondary | ICD-10-CM | POA: Insufficient documentation

## 2020-06-06 DIAGNOSIS — Z923 Personal history of irradiation: Secondary | ICD-10-CM | POA: Insufficient documentation

## 2020-06-06 NOTE — Progress Notes (Signed)
GWENETTA, DEVOS ([id number]) Visit Report for 06/06/2020 Chief Complaint Document Details Patient Name: Date of Service: Creig Hines, DO RA 06/06/2020 1:15 PM Medical Record Number: [id number] Patient Account Number: [id number] Date of Birth/Sex: Treating RN: June 11, 1956 (64 y.o. Female) Deon Pilling Primary Care Provider: Benito Mccreedy Other Clinician: Referring Provider: Treating Provider/Extender: Loma Sender in Treatment: 0 Information Obtained from: Patient Chief Complaint 06/06/2020; patient is here for a itchy rash on the bilateral anterior lower extremities. Electronic Signature(s) Signed: 06/06/2020 5:14:03 PM By: Linton Ham MD Entered By: Linton Ham on 06/06/2020 14:28:04 -------------------------------------------------------------------------------- HPI Details Patient Name: Date of Service: Creig Hines, DO RA 06/06/2020 1:15 PM Medical Record Number: [id number] Patient Account Number: [id number] Date of Birth/Sex: Treating RN: 04-12-1956 (64 y.o. Female) Deon Pilling Primary Care Provider: Benito Mccreedy Other Clinician: Referring Provider: Treating Provider/Extender: Loma Sender in Treatment: 0 History of Present Illness HPI Description: ADMISSION 06/06/2020 This is a 64 year old woman who has an itchy rash on her lower extremities for about a year. She is here for our review of this she has never had an open wound. Past medical history includes a meningioma on her cell right cerebellum treated with a course of radiation from 04/02/2020 through 04/30/2020 Electronic Signature(s) Signed: 06/06/2020 5:14:03 PM By: Linton Ham MD Entered By: Linton Ham on 06/06/2020 14:27:18 -------------------------------------------------------------------------------- Physical Exam Details Patient Name: Date of Service: Creig Hines, DO RA 06/06/2020 1:15 PM Medical Record Number: [id number] Patient  Account Number: [id number] Date of Birth/Sex: Treating RN: Sep 24, 1955 (64 y.o. Female) Deon Pilling Primary Care Provider: Other Clinician: Benito Mccreedy Referring Provider: Treating Provider/Extender: Loma Sender in Treatment: 0 Constitutional Patient is hypertensive.. Pulse regular and within target range for patient.Marland Kitchen Respirations regular, non-labored and within target range.. Temperature is normal and within the target range for the patient.Marland Kitchen Appears in no distress. Cardiovascular Pedal pulses palpable and strong bilaterally.. Minimal edema. Integumentary (Hair, Skin) What looks to be hemosiderin deposition in the bilateral anterior lower legs small patches of similar appearing skin mostly on the right upper. This is a bit unusual for pure stasis dermatitis. Notes Wound exam; there is no open wound Electronic Signature(s) Signed: 06/06/2020 5:14:03 PM By: Linton Ham MD Entered By: Linton Ham on 06/06/2020 14:31:27 -------------------------------------------------------------------------------- Physician Orders Details Patient Name: Date of Service: Creig Hines, DO RA 06/06/2020 1:15 PM Medical Record Number: [id number] Patient Account Number: [id number] Date of Birth/Sex: Treating RN: 10-29-1955 (64 y.o. Female) Deon Pilling Primary Care Provider: Benito Mccreedy Other Clinician: Referring Provider: Treating Provider/Extender: Loma Sender in Treatment: 0 Verbal / Phone Orders: No Diagnosis Coding Discharge From [facility] Services Discharge from [place] - Call if any future wound care needs. Skin Barriers/Peri-Wound Care TCA Cream or Ointment - apply in the cream daily at night. once you complete the cream use lotion daily at night. Edema Control Avoid standing for long periods of time Elevate legs to the level of the heart or above for 30 minutes daily and/or when sitting, a frequency of: -  throughout the day 3-4 times day. Exercise regularly Support Garment 20-30 mm/Hg pressure to: - Patient to purchase compression stockings 20-80mmHg. Apply in the morning and remove at night. Call the company and they will ship to you. Patient Medications llergies: No Known Drug Allergies A Notifications Medication Indication Start End 06/06/2020 triamcinolone acetonide DOSE topical 0.1 % cream - cream topical 1/4 cetaphil cream to affected area daily Electronic Signature(s) Signed: 06/06/2020 2:37:34  PM By: Linton Ham MD Entered By: Linton Ham on 06/06/2020 14:37:32 -------------------------------------------------------------------------------- Problem List Details Patient Name: Date of Service: Creig Hines, DO RA 06/06/2020 1:15 PM Medical Record Number: [id number] Patient Account Number: [id number] Date of Birth/Sex: Treating RN: April 13, 1956 (64 y.o. Female) Deon Pilling Primary Care Provider: Benito Mccreedy Other Clinician: Referring Provider: Treating Provider/Extender: Loma Sender in Treatment: 0 Active Problems ICD-10 Encounter Code Description Active Date MDM Diagnosis I87.323 Chronic venous hypertension (idiopathic) with inflammation of bilateral lower 06/06/2020 No Yes extremity Inactive Problems Resolved Problems Electronic Signature(s) Signed: 06/06/2020 5:14:03 PM By: Linton Ham MD Entered By: Linton Ham on 06/06/2020 14:25:51 -------------------------------------------------------------------------------- Progress Note Details Patient Name: Date of Service: Creig Hines, DO RA 06/06/2020 1:15 PM Medical Record Number: [id number] Patient Account Number: [id number] Date of Birth/Sex: Treating RN: Sep 30, 1955 (64 y.o. Female) Deon Pilling Primary Care Provider: Benito Mccreedy Other Clinician: Referring Provider: Treating Provider/Extender: Loma Sender in Treatment:  0 Subjective Chief Complaint Information obtained from Patient 06/06/2020; patient is here for a itchy rash on the bilateral anterior lower extremities. History of Present Illness (HPI) ADMISSION 06/06/2020 This is a 64 year old woman who has an itchy rash on her lower extremities for about a year. She is here for our review of this she has never had an open wound. Past medical history includes a meningioma on her cell right cerebellum treated with a course of radiation from 04/02/2020 through 04/30/2020 Patient History Allergies No Known Drug Allergies Family History Unknown History. Social History Never smoker, Marital Status - Married, Alcohol Use - Never, Drug Use - No History, Caffeine Use - Rarely. Medical History Cardiovascular Patient has history of Hypertension Oncologic Patient has history of Received Chemotherapy, Received Radiation Medical A Surgical History Notes nd Oncologic Brain cancer Review of Systems (ROS) Constitutional Symptoms (General Health) Denies complaints or symptoms of Fatigue, Fever, Chills, Marked Weight Change. Eyes Denies complaints or symptoms of Dry Eyes, Vision Changes, Glasses / Contacts. Ear/Nose/Mouth/Throat Denies complaints or symptoms of Chronic sinus problems or rhinitis. Respiratory Denies complaints or symptoms of Chronic or frequent coughs, Shortness of Breath. Gastrointestinal Denies complaints or symptoms of Frequent diarrhea, Nausea, Vomiting. Endocrine Denies complaints or symptoms of Heat/cold intolerance. Genitourinary Denies complaints or symptoms of Frequent urination. Integumentary (Skin) Denies complaints or symptoms of Wounds. Musculoskeletal Denies complaints or symptoms of Muscle Pain, Muscle Weakness. Neurologic Denies complaints or symptoms of Numbness/parasthesias. Objective Constitutional Patient is hypertensive.. Pulse regular and within target range for patient.Marland Kitchen Respirations regular, non-labored and  within target range.. Temperature is normal and within the target range for the patient.Marland Kitchen Appears in no distress. Vitals Time Taken: 1:30 PM, Temperature: 98.6 F, Pulse: 94 bpm, Respiratory Rate: 18 breaths/min, Blood Pressure: 156/82 mmHg. Cardiovascular Pedal pulses palpable and strong bilaterally.. Minimal edema. General Notes: Wound exam; there is no open wound Integumentary (Hair, Skin) What looks to be hemosiderin deposition in the bilateral anterior lower legs small patches of similar appearing skin mostly on the right upper. This is a bit unusual for pure stasis dermatitis. Assessment Active Problems ICD-10 Chronic venous hypertension (idiopathic) with inflammation of bilateral lower extremity Plan Discharge From [facility] Services: Discharge from [place] - Call if any future wound care needs. Skin Barriers/Peri-Wound Care: TCA Cream or Ointment - apply in the cream daily at night. once you complete the cream use lotion daily at night. Edema Control: Avoid standing for long periods of time Elevate legs to the level of the heart or above for  30 minutes daily and/or when sitting, a frequency of: - throughout the day 3-4 times day. Exercise regularly Support Garment 20-30 mm/Hg pressure to: - Patient to purchase compression stockings 20-2mmHg. Apply in the morning and remove at night. Call the company and they will ship to you. 1. I am going to give her a prescription for triamcinolone 1-4 and Cetaphil cream to moisturize the skin and hopefully cut down on the venous inflammation 2. Some of this looks like classic venous stasis with hemosiderin deposition. Some of it almost look like some form of scaling rash I am just not sure. 3. Nevertheless there was no open wound we certainly do not need to follow her here. We will give her a prescription for below-knee stockings 20/30 Electronic Signature(s) Signed: 06/06/2020 5:14:03 PM By: Linton Ham MD Entered By: Linton Ham on 06/06/2020 14:33:14 -------------------------------------------------------------------------------- HxROS Details Patient Name: Date of Service: Creig Hines, DO RA 06/06/2020 1:15 PM Medical Record Number: [id number] Patient Account Number: [id number] Date of Birth/Sex: Treating RN: Sep 14, 1955 (64 y.o. Female) Levan Hurst Primary Care Provider: Benito Mccreedy Other Clinician: Referring Provider: Treating Provider/Extender: Loma Sender in Treatment: 0 Constitutional Symptoms (General Health) Complaints and Symptoms: Negative for: Fatigue; Fever; Chills; Marked Weight Change Eyes Complaints and Symptoms: Negative for: Dry Eyes; Vision Changes; Glasses / Contacts Ear/Nose/Mouth/Throat Complaints and Symptoms: Negative for: Chronic sinus problems or rhinitis Respiratory Complaints and Symptoms: Negative for: Chronic or frequent coughs; Shortness of Breath Gastrointestinal Complaints and Symptoms: Negative for: Frequent diarrhea; Nausea; Vomiting Endocrine Complaints and Symptoms: Negative for: Heat/cold intolerance Genitourinary Complaints and Symptoms: Negative for: Frequent urination Integumentary (Skin) Complaints and Symptoms: Negative for: Wounds Musculoskeletal Complaints and Symptoms: Negative for: Muscle Pain; Muscle Weakness Neurologic Complaints and Symptoms: Negative for: Numbness/parasthesias Hematologic/Lymphatic Cardiovascular Medical History: Positive for: Hypertension Immunological Oncologic Medical History: Positive for: Received Chemotherapy; Received Radiation Past Medical History Notes: Brain cancer Immunizations Pneumococcal Vaccine: Received Pneumococcal Vaccination: No Implantable Devices None Family and Social History Unknown History: Yes; Never smoker; Marital Status - Married; Alcohol Use: Never; Drug Use: No History; Caffeine Use: Rarely; Financial Concerns: No; Food, Clothing or Shelter  Needs: No; Support System Lacking: No; Transportation Concerns: No Electronic Signature(s) Signed: 06/06/2020 5:14:03 PM By: Linton Ham MD Signed: 06/06/2020 5:57:23 PM By: Levan Hurst RN, BSN Entered By: Levan Hurst on 06/06/2020 13:35:07 -------------------------------------------------------------------------------- SuperBill Details Patient Name: Date of Service: Creig Hines, DO RA 06/06/2020 Medical Record Number: [id number] Patient Account Number: [id number] Date of Birth/Sex: Treating RN: 30-Dec-1955 (64 y.o. Female) Deon Pilling Primary Care Provider: Benito Mccreedy Other Clinician: Referring Provider: Treating Provider/Extender: Loma Sender in Treatment: 0 Diagnosis Coding ICD-10 Codes Code Description [phone] Chronic venous hypertension (idiopathic) with inflammation of bilateral lower extremity Facility Procedures CPT4 Code: [id number] Description: 27782 - WOUND CARE VISIT-LEV 3 EST PT Modifier: Quantity: 1 Physician Procedures : CPT4 Code Description Modifier [id number] - WC PHYS LEVEL 2 - NEW PT ICD-10 Diagnosis Description I87.323 Chronic venous hypertension (idiopathic) with inflammation of bilateral lower extremity Quantity: 1 Electronic Signature(s) Signed: 06/06/2020 5:14:03 PM By: Linton Ham MD Signed: 06/06/2020 5:14:03 PM By: Linton Ham MD Entered By: Linton Ham on 06/06/2020 14:37:56

## 2020-06-06 NOTE — Progress Notes (Signed)
DOYCE, STONEHOUSE ([id number]) Visit Report for 06/06/2020 Abuse/Suicide Risk Screen Details Patient Name: Date of Service: Belinda Hines, DO RA 06/06/2020 1:15 PM Medical Record Number: [id number] Patient Account Number: [id number] Date of Birth/Sex: Treating RN: 12/06/1955 (64 y.o. Female) Levan Hurst Primary Care Dimetrius Montfort: Benito Mccreedy Other Clinician: Referring Lionel Woodberry: Treating Eliannah Hinde/Extender: Loma Sender in Treatment: 0 Abuse/Suicide Risk Screen Items Answer ABUSE RISK SCREEN: Has anyone close to you tried to hurt or harm you recentlyo No Do you feel uncomfortable with anyone in your familyo No Has anyone forced you do things that you didnt want to doo No Electronic Signature(s) Signed: 06/06/2020 5:57:23 PM By: Levan Hurst RN, BSN Entered By: Levan Hurst on 06/06/2020 13:35:21 -------------------------------------------------------------------------------- Activities of Daily Living Details Patient Name: Date of Service: Belinda Hines, DO RA 06/06/2020 1:15 PM Medical Record Number: [id number] Patient Account Number: [id number] Date of Birth/Sex: Treating RN: 10-15-1955 (64 y.o. Female) Levan Hurst Primary Care Detra Bores: Benito Mccreedy Other Clinician: Referring Jaskaran Dauzat: Treating Umaiza Matusik/Extender: Loma Sender in Treatment: 0 Activities of Daily Living Items Answer Activities of Daily Living (Please select one for each item) Drive Automobile Need Assistance T Medications ake Completely Able Use T elephone Completely Able Care for Appearance Completely Able Use T oilet Completely Able Bath / Shower Completely Able Dress Self Completely Able Feed Self Completely Able Walk Completely Able Get In / Out Bed Completely Able Housework Completely Able Prepare Meals Completely Able Handle Money Completely Able Shop for Self Need Assistance Electronic Signature(s) Signed: 06/06/2020 5:57:23 PM By:  Levan Hurst RN, BSN Entered By: Levan Hurst on 06/06/2020 13:35:47 -------------------------------------------------------------------------------- Education Screening Details Patient Name: Date of Service: Belinda Hines, DO RA 06/06/2020 1:15 PM Medical Record Number: [id number] Patient Account Number: [id number] Date of Birth/Sex: Treating RN: August 31, 1955 (64 y.o. Female) Levan Hurst Primary Care Cady Hafen: Benito Mccreedy Other Clinician: Referring Brantlee Hinde: Treating Makynzee Tigges/Extender: Loma Sender in Treatment: 0 Primary Learner Assessed: Patient Learning Preferences/Education Level/Primary Language Learning Preference: Explanation, Demonstration, Printed Material Highest Education Level: High School Preferred Language: Patient Declined Cognitive Barrier Language Barrier: No Translator Needed: No Memory Deficit: No Emotional Barrier: No Cultural/Religious Beliefs Affecting Medical Care: No Physical Barrier Impaired Vision: No Impaired Hearing: No Decreased Hand dexterity: No Knowledge/Comprehension Knowledge Level: High Comprehension Level: High Ability to understand written instructions: High Ability to understand verbal instructions: High Motivation Anxiety Level: Calm Cooperation: Cooperative Education Importance: Acknowledges Need Interest in Health Problems: Asks Questions Perception: Coherent Willingness to Engage in Self-Management High Activities: Readiness to Engage in Self-Management High Activities: Electronic Signature(s) Signed: 06/06/2020 5:57:23 PM By: Levan Hurst RN, BSN Entered By: Levan Hurst on 06/06/2020 13:36:04 -------------------------------------------------------------------------------- Fall Risk Assessment Details Patient Name: Date of Service: Belinda Hines, DO RA 06/06/2020 1:15 PM Medical Record Number: [id number] Patient Account Number: [id number] Date of Birth/Sex: Treating RN: [birth year] (64  y.o. Female) Levan Hurst Primary Care Keyani Rigdon: Benito Mccreedy Other Clinician: Referring Jonahtan Manseau: Treating Alfred Eckley/Extender: Loma Sender in Treatment: 0 Fall Risk Assessment Items Have you had 2 or more falls in the last 12 monthso 0 No Have you had any fall that resulted in injury in the last 12 monthso 0 No FALLS RISK SCREEN History of falling - immediate or within 3 months 0 No Secondary diagnosis (Do you have 2 or more medical diagnoseso) 15 Yes Ambulatory aid None/bed rest/wheelchair/nurse 0 Yes Crutches/cane/walker 0 No Furniture 0 No Intravenous therapy Access/Saline/Heparin Lock 0 No Gait/Transferring Normal/ bed rest/ wheelchair 0 Yes  Weak (short steps with or without shuffle, stooped but able to lift head while walking, may seek 0 No support from furniture) Impaired (short steps with shuffle, may have difficulty arising from chair, head down, impaired 0 No balance) Mental Status Oriented to own ability 0 Yes Electronic Signature(s) Signed: 06/06/2020 5:57:23 PM By: Levan Hurst RN, BSN Entered By: Levan Hurst on 06/06/2020 13:36:15 -------------------------------------------------------------------------------- Nutrition Risk Screening Details Patient Name: Date of Service: Belinda Hines, DO RA 06/06/2020 1:15 PM Medical Record Number: [id number] Patient Account Number: [id number] Date of Birth/Sex: Treating RN: 1955/12/08 (64 y.o. Female) Levan Hurst Primary Care Lathen Seal: Benito Mccreedy Other Clinician: Referring Cathlene Gardella: Treating Kane Kusek/Extender: Loma Sender in Treatment: 0 Height (in): Weight (lbs): Body Mass Index (BMI): Nutrition Risk Screening Items Score Screening NUTRITION RISK SCREEN: I have an illness or condition that made me change the kind and/or amount of food I eat 0 No I eat fewer than two meals per day 0 No I eat few fruits and vegetables, or milk products 0  No I have three or more drinks of beer, liquor or wine almost every day 0 No I have tooth or mouth problems that make it hard for me to eat 0 No I don't always have enough money to buy the food I need 0 No I eat alone most of the time 0 No I take three or more different prescribed or over-the-counter drugs a day 1 Yes Without wanting to, I have lost or gained 10 pounds in the last six months 0 No I am not always physically able to shop, cook and/or feed myself 2 Yes Nutrition Protocols Good Risk Protocol Moderate Risk Protocol 0 Provide education on nutrition High Risk Proctocol Risk Level: Moderate Risk Score: 3 Electronic Signature(s) Signed: 06/06/2020 5:57:23 PM By: Levan Hurst RN, BSN Entered By: Levan Hurst on 06/06/2020 13:36:26

## 2020-06-06 NOTE — Progress Notes (Signed)
ANGI, GOODELL ([id number]) Visit Report for 06/06/2020 Allergy List Details Patient Name: Date of Service: Belinda Hines, DO RA 06/06/2020 1:15 PM Medical Record Number: [id number] Patient Account Number: [id number] Date of Birth/Sex: Treating RN: 01-02-1956 (64 y.o. Female) Levan Hurst Primary Care Talley Casco: Benito Mccreedy Other Clinician: Referring Lua Feng: Treating Vijay Durflinger/Extender: Loma Sender in Treatment: 0 Allergies Active Allergies No Known Drug Allergies Allergy Notes Electronic Signature(s) Signed: 06/06/2020 5:57:23 PM By: Levan Hurst RN, BSN Entered By: Levan Hurst on 06/06/2020 13:32:09 -------------------------------------------------------------------------------- Arrival Information Details Patient Name: Date of Service: Belinda Hines, DO RA 06/06/2020 1:15 PM Medical Record Number: [id number] Patient Account Number: [id number] Date of Birth/Sex: Treating RN: 09/02/1955 (64 y.o. Female) Levan Hurst Primary Care Neliah Cuyler: Benito Mccreedy Other Clinician: Referring Franciscojavier Wronski: Treating Kaenan Jake/Extender: Loma Sender in Treatment: 0 Visit Information Patient Arrived: Ambulatory Arrival Time: 13:28 Accompanied By: son-in-law Transfer Assistance: None Patient Identification Verified: Yes Secondary Verification Process Completed: Yes Patient Requires Transmission-Based Precautions: No Patient Has Alerts: No Electronic Signature(s) Signed: 06/06/2020 5:57:23 PM By: Levan Hurst RN, BSN Entered By: Levan Hurst on 06/06/2020 13:29:42 -------------------------------------------------------------------------------- Clinic Level of Care Assessment Details Patient Name: Date of Service: Belinda Hines, DO RA 06/06/2020 1:15 PM Medical Record Number: [id number] Patient Account Number: [id number] Date of Birth/Sex: Treating RN: 1955/09/21 (64 y.o. Female) Deon Pilling Primary Care Lita Flynn: Benito Mccreedy Other Clinician: Referring Cayleb Jarnigan: Treating Jalaila Caradonna/Extender: Loma Sender in Treatment: 0 Clinic Level of Care Assessment Items TOOL 2 Quantity Score X- 1 0 Use when only an EandM is performed on the INITIAL visit ASSESSMENTS - Nursing Assessment / Reassessment X- 1 20 General Physical Exam (combine w/ comprehensive assessment (listed just below) when performed on new pt. evals) X- 1 25 Comprehensive Assessment (HX, ROS, Risk Assessments, Wounds Hx, etc.) ASSESSMENTS - Wound and Skin A ssessment / Reassessment []  - 0 Simple Wound Assessment / Reassessment - one wound []  - 0 Complex Wound Assessment / Reassessment - multiple wounds X- 1 10 Dermatologic / Skin Assessment (not related to wound area) ASSESSMENTS - Ostomy and/or Continence Assessment and Care []  - 0 Incontinence Assessment and Management []  - 0 Ostomy Care Assessment and Management (repouching, etc.) PROCESS - Coordination of Care X - Simple Patient / Family Education for ongoing care 1 15 []  - 0 Complex (extensive) Patient / Family Education for ongoing care X- 1 10 Staff obtains Programmer, systems, Records, T Results / Process Orders est []  - 0 Staff telephones HHA, Nursing Homes / Clarify orders / etc []  - 0 Routine Transfer to another Facility (non-emergent condition) []  - 0 Routine Hospital Admission (non-emergent condition) []  - 0 New Admissions / Biomedical engineer / Ordering NPWT Apligraf, etc. , []  - 0 Emergency Hospital Admission (emergent condition) X- 1 10 Simple Discharge Coordination []  - 0 Complex (extensive) Discharge Coordination PROCESS - Special Needs []  - 0 Pediatric / Minor Patient Management []  - 0 Isolation Patient Management []  - 0 Hearing / Language / Visual special needs []  - 0 Assessment of Community assistance (transportation, D/C planning, etc.) []  - 0 Additional assistance / Altered mentation []  - 0 Support Surface(s) Assessment  (bed, cushion, seat, etc.) INTERVENTIONS - Wound Cleansing / Measurement []  - 0 Wound Imaging (photographs - any number of wounds) []  - 0 Wound Tracing (instead of photographs) []  - 0 Simple Wound Measurement - one wound []  - 0 Complex Wound Measurement - multiple wounds []  - 0 Simple Wound Cleansing - one wound []  -  0 Complex Wound Cleansing - multiple wounds INTERVENTIONS - Wound Dressings []  - 0 Small Wound Dressing one or multiple wounds []  - 0 Medium Wound Dressing one or multiple wounds []  - 0 Large Wound Dressing one or multiple wounds []  - 0 Application of Medications - injection INTERVENTIONS - Miscellaneous []  - 0 External ear exam []  - 0 Specimen Collection (cultures, biopsies, blood, body fluids, etc.) []  - 0 Specimen(s) / Culture(s) sent or taken to Lab for analysis []  - 0 Patient Transfer (multiple staff / Harrel Lemon Lift / Similar devices) []  - 0 Simple Staple / Suture removal (25 or less) []  - 0 Complex Staple / Suture removal (26 or more) []  - 0 Hypo / Hyperglycemic Management (close monitor of Blood Glucose) []  - 0 Ankle / Brachial Index (ABI) - do not check if billed separately Has the patient been seen at the hospital within the last three years: Yes Total Score: 90 Level Of Care: New/Established - Level 3 Electronic Signature(s) Signed: 06/06/2020 5:38:55 PM By: Deon Pilling Entered By: Deon Pilling on 06/06/2020 14:02:46 -------------------------------------------------------------------------------- Multi Wound Chart Details Patient Name: Date of Service: Belinda Hines, DO RA 06/06/2020 1:15 PM Medical Record Number: [id number] Patient Account Number: [id number] Date of Birth/Sex: Treating RN: Jul 15, 1956 (64 y.o. Female) Deon Pilling Primary Care Jolynn Bajorek: Benito Mccreedy Other Clinician: Referring Caliann Leckrone: Treating Lorance Pickeral/Extender: Loma Sender in Treatment: 0 Vital Signs Height(in): Pulse(bpm):  94 Weight(lbs): Blood Pressure(mmHg): 156/82 Body Mass Index(BMI): Temperature(F): 98.6 Respiratory Rate(breaths/min): 18 Wound Assessments Treatment Notes Electronic Signature(s) Signed: 06/06/2020 5:14:03 PM By: Linton Ham MD Signed: 06/06/2020 5:38:55 PM By: Deon Pilling Entered By: Linton Ham on 06/06/2020 14:26:00 -------------------------------------------------------------------------------- Multi-Disciplinary Care Plan Details Patient Name: Date of Service: Belinda Hines, DO RA 06/06/2020 1:15 PM Medical Record Number: [id number] Patient Account Number: [id number] Date of Birth/Sex: Treating RN: 1956/04/22 (64 y.o. Female) Deon Pilling Primary Care Ellice Boultinghouse: Benito Mccreedy Other Clinician: Referring Christyana Corwin: Treating Shivangi Lutz/Extender: Loma Sender in Treatment: 0 Active Inactive Electronic Signature(s) Signed: 06/06/2020 5:38:55 PM By: Deon Pilling Entered By: Deon Pilling on 06/06/2020 14:01:41 -------------------------------------------------------------------------------- Pain Assessment Details Patient Name: Date of Service: Belinda Hines, DO RA 06/06/2020 1:15 PM Medical Record Number: [id number] Patient Account Number: [id number] Date of Birth/Sex: Treating RN: [birth year] (64 y.o. Female) Levan Hurst Primary Care Kenzie Flakes: Benito Mccreedy Other Clinician: Referring Dequavion Follette: Treating Dicky Boer/Extender: Loma Sender in Treatment: 0 Active Problems Location of Pain Severity and Description of Pain Patient Has Paino No Site Locations Pain Management and Medication Current Pain Management: Electronic Signature(s) Signed: 06/06/2020 5:57:23 PM By: Levan Hurst RN, BSN Entered By: Levan Hurst on 06/06/2020 13:36:37 -------------------------------------------------------------------------------- Patient/Caregiver Education Details Patient Name: Date of Service: Belinda Hines, DO RA  11/18/2021andnbsp1:15 PM Medical Record Number: [id number] Patient Account Number: [id number] Date of Birth/Gender: Treating RN: Nov 21, 1955 (65 y.o. Female) Deon Pilling Primary Care Physician: Benito Mccreedy Other Clinician: Referring Physician: Treating Physician/Extender: Loma Sender in Treatment: 0 Education Assessment Education Provided To: Patient Education Topics Provided Venous: Handouts: Controlling Swelling with Compression Stockings Methods: Explain/Verbal, Printed Responses: Reinforcements needed Wound/Skin Impairment: Methods: Explain/Verbal, Printed Responses: Reinforcements needed Electronic Signature(s) Signed: 06/06/2020 5:38:55 PM By: Deon Pilling Entered By: Deon Pilling on 06/06/2020 14:02:15 -------------------------------------------------------------------------------- Vitals Details Patient Name: Date of Service: Belinda Hines, DO RA 06/06/2020 1:15 PM Medical Record Number: [id number] Patient Account Number: [id number] Date of Birth/Sex: Treating RN: [birth year] (64 y.o. Female) Levan Hurst Primary Care Jenissa Tyrell: Benito Mccreedy Other Clinician: Referring  Janie Strothman: Treating Alphonse Asbridge/Extender: Loma Sender in Treatment: 0 Vital Signs Time Taken: 13:30 Temperature (F): 98.6 Pulse (bpm): 94 Respiratory Rate (breaths/min): 18 Blood Pressure (mmHg): 156/82 Reference Range: 80 - 120 mg / dl Electronic Signature(s) Signed: 06/06/2020 5:57:23 PM By: Levan Hurst RN, BSN Entered By: Levan Hurst on 06/06/2020 13:31:40

## 2020-06-25 NOTE — Progress Notes (Signed)
  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   Plan: The patient will follow-up with radiation oncology in 1 mo.  -----------------------------------  Eppie Gibson, MD

## 2020-07-08 ENCOUNTER — Other Ambulatory Visit: Payer: Self-pay | Admitting: Obstetrics and Gynecology

## 2020-07-08 DIAGNOSIS — R928 Other abnormal and inconclusive findings on diagnostic imaging of breast: Secondary | ICD-10-CM

## 2020-07-24 ENCOUNTER — Ambulatory Visit
Admission: RE | Admit: 2020-07-24 | Discharge: 2020-07-24 | Disposition: A | Discharge status: Home / Self Care | Payer: 59 | Source: Ambulatory Visit | Attending: Obstetrics and Gynecology | Admitting: Obstetrics and Gynecology

## 2020-07-24 ENCOUNTER — Other Ambulatory Visit: Payer: Self-pay

## 2020-07-24 ENCOUNTER — Ambulatory Visit: Payer: 59

## 2020-07-24 DIAGNOSIS — R928 Other abnormal and inconclusive findings on diagnostic imaging of breast: Secondary | ICD-10-CM

## 2020-08-09 ENCOUNTER — Other Ambulatory Visit: Payer: Self-pay | Admitting: Gastroenterology

## 2020-08-16 ENCOUNTER — Other Ambulatory Visit: Payer: 59

## 2020-08-30 NOTE — Progress Notes (Signed)
Attempted to obtain medical history via telephone, unable to reach at this time. No voicemail set up. Use lang line ID# [id number]

## 2020-09-03 ENCOUNTER — Ambulatory Visit
Admission: RE | Admit: 2020-09-03 | Discharge: 2020-09-03 | Disposition: A | Discharge status: Home / Self Care | Payer: 59 | Source: Ambulatory Visit | Attending: Obstetrics and Gynecology | Admitting: Obstetrics and Gynecology

## 2020-09-03 ENCOUNTER — Other Ambulatory Visit: Payer: 59

## 2020-09-03 ENCOUNTER — Other Ambulatory Visit: Payer: Self-pay

## 2020-09-03 DIAGNOSIS — R928 Other abnormal and inconclusive findings on diagnostic imaging of breast: Secondary | ICD-10-CM

## 2020-09-03 DIAGNOSIS — Z20822 Contact with and (suspected) exposure to covid-19: Secondary | ICD-10-CM

## 2020-09-04 LAB — NOVEL CORONAVIRUS, NAA: SARS-CoV-2, NAA: NOT DETECTED

## 2020-09-04 LAB — SARS-COV-2, NAA 2 DAY TAT

## 2020-09-06 ENCOUNTER — Encounter (HOSPITAL_COMMUNITY): Payer: Self-pay | Admitting: Gastroenterology

## 2020-09-06 ENCOUNTER — Ambulatory Visit (HOSPITAL_COMMUNITY): Payer: 59 | Admitting: Certified Registered"

## 2020-09-06 ENCOUNTER — Ambulatory Visit (HOSPITAL_COMMUNITY)
Admission: RE | Admit: 2020-09-06 | Discharge: 2020-09-06 | Disposition: A | Discharge status: Home / Self Care | Payer: 59 | Attending: Gastroenterology | Admitting: Gastroenterology

## 2020-09-06 ENCOUNTER — Other Ambulatory Visit: Payer: Self-pay

## 2020-09-06 ENCOUNTER — Encounter (HOSPITAL_COMMUNITY)
Admission: RE | Disposition: A | Discharge status: Home / Self Care | Payer: Self-pay | Source: Home / Self Care | Attending: Gastroenterology

## 2020-09-06 DIAGNOSIS — K259 Gastric ulcer, unspecified as acute or chronic, without hemorrhage or perforation: Secondary | ICD-10-CM | POA: Insufficient documentation

## 2020-09-06 DIAGNOSIS — Z79899 Other long term (current) drug therapy: Secondary | ICD-10-CM | POA: Diagnosis not present

## 2020-09-06 DIAGNOSIS — K3189 Other diseases of stomach and duodenum: Secondary | ICD-10-CM | POA: Insufficient documentation

## 2020-09-06 DIAGNOSIS — K317 Polyp of stomach and duodenum: Secondary | ICD-10-CM | POA: Diagnosis not present

## 2020-09-06 HISTORY — PX: HEMOSTASIS CLIP PLACEMENT: SHX6857

## 2020-09-06 HISTORY — PX: POLYPECTOMY: SHX5525

## 2020-09-06 HISTORY — PX: ESOPHAGOGASTRODUODENOSCOPY (EGD) WITH PROPOFOL: SHX5813

## 2020-09-06 SURGERY — ESOPHAGOGASTRODUODENOSCOPY (EGD) WITH PROPOFOL
Anesthesia: Monitor Anesthesia Care

## 2020-09-06 MED ORDER — LACTATED RINGERS IV SOLN: INTRAVENOUS | Status: DC

## 2020-09-06 MED ORDER — LIDOCAINE 2% (20 MG/ML) 5 ML SYRINGE
INTRAMUSCULAR | Status: DC | PRN
  Administered 2020-09-06: 40 mg via INTRAVENOUS

## 2020-09-06 MED ORDER — PROPOFOL 10 MG/ML IV BOLUS
INTRAVENOUS | Status: DC | PRN
  Administered 2020-09-06 (×5): 40 mg via INTRAVENOUS

## 2020-09-06 MED ORDER — SODIUM CHLORIDE 0.9 % IV SOLN: INTRAVENOUS | Status: DC

## 2020-09-06 SURGICAL SUPPLY — 14 items

## 2020-09-06 NOTE — Anesthesia Procedure Notes (Signed)
Procedure Name: MAC Date/Time: 09/06/2020 10:56 AM Performed by: Cynda Familia, CRNA Pre-anesthesia Checklist: Patient identified, Emergency Drugs available, Suction available, Patient being monitored and Timeout performed Patient Re-evaluated:Patient Re-evaluated prior to induction Oxygen Delivery Method: Simple face mask Placement Confirmation: positive ETCO2 and breath sounds checked- equal and bilateral Dental Injury: Teeth and Oropharynx as per pre-operative assessment

## 2020-09-06 NOTE — H&P (Signed)
  Belinda Berg   HPI: During a recent EGD with Dr. Collene Mares an EGD was performed for findings of IDA. A large pedunculated gastric polyp was noted to be prolpasing through the pylorus. She was diagnosed with being H. Pylori positive and she is here today for resection of the polyp.    Past Medical History:  Diagnosis Date  . Hypertension     Past Surgical History:  Procedure Laterality Date  . APPLICATION OF CRANIAL NAVIGATION N/A 12/20/2019   Procedure: APPLICATION OF CRANIAL NAVIGATION;  Surgeon: Judith Part, MD;  Location: [place];  Service: Neurosurgery;  Laterality: N/A;  . CRANIOTOMY Right 12/20/2019   Procedure: CRANIOTOMY TUMOR RESECTION;  Surgeon: Judith Part, MD;  Location: [facility];  Service: Neurosurgery;  Laterality: Right;    History reviewed. No pertinent family history.  Social History:  reports that she has never smoked. She has never used smokeless tobacco. She reports that she does not drink alcohol and does not use drugs.  Allergies: No Known Allergies  Medications: Scheduled: Continuous:  No results found for this or any previous visit (from the past 24 hour(s)).   No results found.  ROS:  As stated above in the HPI otherwise negative.  There were no vitals taken for this visit.    PE: Gen: NAD, Alert and Oriented HEENT:  [place]/AT, EOMI Neck: Supple, no LAD Lungs: CTA Bilaterally CV: RRR without M/G/R ABD: Soft, NTND, +BS Ext: No C/C/E  Assessment/Plan: 1) Gastric polyp - EGD with polypectomy.  Rogerick Baldwin D 09/06/2020, 9:23 AM

## 2020-09-06 NOTE — Transfer of Care (Signed)
Immediate Anesthesia Transfer of Care Note  Patient: Belinda Berg  Procedure(s) Performed: ESOPHAGOGASTRODUODENOSCOPY (EGD) WITH PROPOFOL (N/A ) POLYPECTOMY HEMOSTASIS CLIP PLACEMENT  Patient Location: PACU and Endoscopy Unit  Anesthesia Type:General  Level of Consciousness: awake and alert   Airway & Oxygen Therapy: Patient Spontanous Breathing and Patient connected to face mask oxygen  Post-op Assessment: Report given to RN and Post -op Vital signs reviewed and stable  Post vital signs: Reviewed and stable  Last Vitals:  Vitals Value Taken Time  BP    Temp    Pulse    Resp    SpO2      Last Pain:  Vitals:   09/06/20 0950  TempSrc: Oral  PainSc: 0-No pain         Complications: No complications documented.

## 2020-09-06 NOTE — Anesthesia Preprocedure Evaluation (Addendum)
Anesthesia Evaluation  Patient identified by MRN, date of birth, ID band Patient awake    Reviewed: Allergy & Precautions, NPO status , Patient's Chart, lab work & pertinent test results, reviewed documented beta blocker date and time   Airway Mallampati: II  TM Distance: >3 FB Neck ROM: Full    Dental  (+) Teeth Intact, Dental Advisory Given   Pulmonary neg pulmonary ROS,    Pulmonary exam normal breath sounds clear to auscultation       Cardiovascular hypertension, Pt. on medications and Pt. on home beta blockers Normal cardiovascular exam Rhythm:Regular Rate:Normal     Neuro/Psych negative neurological ROS  negative psych ROS   GI/Hepatic Neg liver ROS, pedunculated polyp in gastric antrum   Endo/Other  negative endocrine ROS  Renal/GU negative Renal ROS     Musculoskeletal negative musculoskeletal ROS (+)   Abdominal   Peds  Hematology negative hematology ROS (+)   Anesthesia Other Findings Day of surgery medications reviewed with the patient.  Reproductive/Obstetrics                            Anesthesia Physical Anesthesia Plan  ASA: II  Anesthesia Plan: MAC   Post-op Pain Management:    Induction: Intravenous  PONV Risk Score and Plan: 2 and Propofol infusion and Treatment may vary due to age or medical condition  Airway Management Planned: Nasal Cannula and Natural Airway  Additional Equipment:   Intra-op Plan:   Post-operative Plan:   Informed Consent: I have reviewed the patients History and Physical, chart, labs and discussed the procedure including the risks, benefits and alternatives for the proposed anesthesia with the patient or authorized representative who has indicated his/her understanding and acceptance.     Dental advisory given and Consent reviewed with POA  Plan Discussed with: CRNA and Anesthesiologist  Anesthesia Plan Comments: (Phone consent  with patient's daughter.)       Anesthesia Quick Evaluation

## 2020-09-06 NOTE — Discharge Instructions (Signed)

## 2020-09-06 NOTE — Op Note (Signed)
[facility] Patient Name: Belinda Berg Procedure Date: 09/06/2020 MRN: [id number] Attending MD: Carol Ada , MD Date of Birth: [birth year] CSN: [id number] Age: 65 Admit Type: Outpatient Procedure:                Upper GI endoscopy Indications:              Therapeutic procedure Providers:                Carol Ada, MD, Cleda Daub, RN, Cletis Athens,                            Technician Referring MD:              Medicines:                Propofol per Anesthesia Complications:            No immediate complications. Estimated Blood Loss:     Estimated blood loss: none. Procedure:                Pre-Anesthesia Assessment:                           - Prior to the procedure, a History and Physical                            was performed, and patient medications and                            allergies were reviewed. The patient's tolerance of                            previous anesthesia was also reviewed. The risks                            and benefits of the procedure and the sedation                            options and risks were discussed with the patient.                            All questions were answered, and informed consent                            was obtained. Prior Anticoagulants: The patient has                            taken no previous anticoagulant or antiplatelet                            agents. ASA Grade Assessment: III - A patient with                            severe systemic disease. After reviewing the risks                            and  benefits, the patient was deemed in                            satisfactory condition to undergo the procedure.                           - Sedation was administered by an anesthesia                            professional. Deep sedation was attained.                           After obtaining informed consent, the endoscope was                            passed under direct vision. Throughout  the                            procedure, the patient's blood pressure, pulse, and                            oxygen saturations were monitored continuously. The                            [id number]-[id number] ([id number]) was introduced through the                            mouth, and advanced to the second part of duodenum.                            The upper GI endoscopy was accomplished without                            difficulty. The patient tolerated the procedure                            well. Scope In: Scope Out: Findings:      The esophagus was normal.      A single 10 mm sessile polyp with no bleeding and no stigmata of recent       bleeding was found at the pylorus. The polyp was removed with a hot       snare. Resection and retrieval were complete. To prevent bleeding       post-intervention, two hemostatic clips were successfully placed (MR       conditional). There was no bleeding at the end of the procedure.      The examined duodenum was normal. Impression:               - Normal esophagus.                           - A single gastric polyp. Resected and retrieved.                            Clips (MR conditional) were placed.                           -  Normal examined duodenum. Moderate Sedation:      Not Applicable - Patient had care per Anesthesia. Recommendation:           - Patient has a contact number available for                            emergencies. The signs and symptoms of potential                            delayed complications were discussed with the                            patient. Return to normal activities tomorrow.                            Written discharge instructions were provided to the                            patient.                           - Resume previous diet.                           - Continue present medications.                           - Await pathology results.                           - Return to GI clinic in 4  weeks. Procedure Code(s):        --- Professional ---                           [phone], Esophagogastroduodenoscopy, flexible,                            transoral; with removal of tumor(s), polyp(s), or                            other lesion(s) by snare technique Diagnosis Code(s):        --- Professional ---                           K31.7, Polyp of stomach and duodenum CPT copyright 2019 [employer]. All rights reserved. The codes documented in this report are preliminary and upon coder review may  be revised to meet current compliance requirements. Carol Ada, MD Carol Ada, MD 09/06/2020 11:25:06 AM This report has been signed electronically. Number of Addenda: 0

## 2020-09-08 NOTE — Anesthesia Postprocedure Evaluation (Signed)
Anesthesia Post Note  Patient: Belinda Berg  Procedure(s) Performed: ESOPHAGOGASTRODUODENOSCOPY (EGD) WITH PROPOFOL (N/A ) POLYPECTOMY HEMOSTASIS CLIP PLACEMENT     Patient location during evaluation: Endoscopy Anesthesia Type: MAC Level of consciousness: awake and alert Pain management: pain level controlled Vital Signs Assessment: post-procedure vital signs reviewed and stable Respiratory status: spontaneous breathing, nonlabored ventilation, respiratory function stable and patient connected to nasal cannula oxygen Cardiovascular status: stable and blood pressure returned to baseline Postop Assessment: no apparent nausea or vomiting Anesthetic complications: no   No complications documented.  Last Vitals:  Vitals:   09/06/20 1130 09/06/20 1140  BP: 106/60 117/64  Pulse: 81 77  Resp: 19 17  Temp:    SpO2: 99% 99%    Last Pain:  Vitals:   09/06/20 1140  TempSrc:   PainSc: 0-No pain                 Catalina Gravel

## 2020-09-09 LAB — SURGICAL PATHOLOGY

## 2020-09-10 ENCOUNTER — Encounter (HOSPITAL_COMMUNITY): Payer: Self-pay | Admitting: Gastroenterology

## 2020-10-24 ENCOUNTER — Ambulatory Visit
Admission: RE | Admit: 2020-10-24 | Discharge: 2020-10-24 | Disposition: A | Discharge status: Home / Self Care | Payer: 59 | Source: Ambulatory Visit | Attending: Radiation Oncology | Admitting: Radiation Oncology

## 2020-10-24 ENCOUNTER — Other Ambulatory Visit: Payer: Self-pay

## 2020-10-24 DIAGNOSIS — D329 Benign neoplasm of meninges, unspecified: Secondary | ICD-10-CM

## 2020-10-24 MED ORDER — GADOBENATE DIMEGLUMINE 529 MG/ML IV SOLN
14.0000 mL | Freq: Once | INTRAVENOUS | Status: AC | PRN
  Administered 2020-10-24: 14 mL via INTRAVENOUS

## 2020-10-28 ENCOUNTER — Inpatient Hospital Stay: Payer: 59 | Attending: Internal Medicine | Admitting: Internal Medicine

## 2020-10-28 ENCOUNTER — Other Ambulatory Visit: Payer: Self-pay

## 2020-10-28 VITALS — BP 142/77 | HR 90 | Temp 98.4°F | Resp 16 | Ht 60.0 in | Wt 152.5 lb

## 2020-10-28 DIAGNOSIS — D320 Benign neoplasm of cerebral meninges: Secondary | ICD-10-CM | POA: Diagnosis not present

## 2020-10-28 NOTE — Progress Notes (Signed)
[facility] at [address], [place] [address] [phone]   New Patient Evaluation  Date of Service: 10/28/20 Patient Name: Belinda Berg Patient MRN: [id number] Patient DOB: [birth year] Provider: Ventura Sellers, MD  Identifying Statement:  Belinda Berg is a 65 y.o. female with posterior fossa meningioma who presents for initial consultation and evaluation.    Referring Provider: Eppie Gibson, MD [address],  [place] [address]  Oncologic History: 12/20/19: Sub-occipital craniotomy, debulking resection w/ Dr. Zada Finders.  Path is grade I histology. 04/29/20: Completes 6 weeks IMRT with Dr. Isidore Moos   History of Present Illness: The patient's records from the referring physician were obtained and reviewed and the patient interviewed to confirm this HPI.  Belinda Berg presented to medical attention initially in May of last year with new onset progressive headaches.  She did not have significant headache history; CNS imaging demonstrated large dural based mass in the R cerebellar hemisphere with compression and hydrocephalus.  She underwent debulking craniotomy with Dr. Zada Finders on 12/19/20; path demonstrated grade I meningioma.  This was followed by IMRT with Dr. Isidore Moos, which was completed in October of this past year.  She denies headaches, seizures, balance issues today.  Daughter acknowledges she is more easily fatigued and "slower with memory" compared to prior.  She currently cares for her 10 year old grandson, and is overall active and independent.   Medications: Current Outpatient Medications on File Prior to Visit  Medication Sig Dispense Refill  . amLODipine (NORVASC) 10 MG tablet Take 10 mg by mouth daily.    Berg Kitchen atorvastatin (LIPITOR) 40 MG tablet Take 40 mg by mouth at bedtime.    . carvedilol (COREG) 6.25 MG tablet Take 6.25 mg by mouth 2 (two) times daily.    . hydrochlorothiazide (HYDRODIURIL) 25 MG tablet Take 25 mg  by mouth daily.    Berg Kitchen losartan (COZAAR) 50 MG tablet Take 50 mg by mouth daily.    . Multiple Vitamin (MULTIVITAMIN WITH MINERALS) TABS tablet Take 1 tablet by mouth daily.     No current facility-administered medications on file prior to visit.    Allergies: No Known Allergies Past Medical History:  Past Medical History:  Diagnosis Date  . Hypertension    Past Surgical History:  Past Surgical History:  Procedure Laterality Date  . APPLICATION OF CRANIAL NAVIGATION N/A 12/20/2019   Procedure: APPLICATION OF CRANIAL NAVIGATION;  Surgeon: Judith Part, MD;  Location: [facility];  Service: Neurosurgery;  Laterality: N/A;  . CRANIOTOMY Right 12/20/2019   Procedure: CRANIOTOMY TUMOR RESECTION;  Surgeon: Judith Part, MD;  Location: [facility];  Service: Neurosurgery;  Laterality: Right;  . ESOPHAGOGASTRODUODENOSCOPY (EGD) WITH PROPOFOL N/A 09/06/2020   Procedure: ESOPHAGOGASTRODUODENOSCOPY (EGD) WITH PROPOFOL;  Surgeon: Carol Ada, MD;  [facility];  Service: Endoscopy;  Laterality: N/A;  . HEMOSTASIS CLIP PLACEMENT  09/06/2020   Procedure: HEMOSTASIS CLIP PLACEMENT;  Surgeon: Carol Ada, MD;  [facility];  Service: Endoscopy;;  . POLYPECTOMY  09/06/2020   Procedure: POLYPECTOMY;  Surgeon: Carol Ada, MD;  [facility];  Service: Endoscopy;;   Social History:  Social History   Socioeconomic History  . Marital status: Single    Spouse name: Not on file  . Number of children: Not on file  . Years of education: Not on file  . Highest education level: Not on file  Occupational History  . Not on file  Tobacco Use  . Smoking status:  Never Smoker  . Smokeless tobacco: Never Used  Vaping Use  . Vaping Use: Never used  Substance and Sexual Activity  . Alcohol use: Never  . Drug use: Never  . Sexual activity: Not Currently  Other Topics Concern  . Not on file  Social History Narrative  . Not on file   Social Determinants of Health    Financial Resource Strain: Not on file  Food Insecurity: Not on file  Transportation Needs: Not on file  Physical Activity: Not on file  Stress: Not on file  Social Connections: Not on file  Intimate Partner Violence: Not on file   Family History: No family history on file.  Review of Systems: Constitutional: Doesn't report fevers, chills or abnormal weight loss Eyes: Doesn't report blurriness of vision Ears, nose, mouth, throat, and face: Doesn't report sore throat Respiratory: Doesn't report cough, dyspnea or wheezes Cardiovascular: Doesn't report palpitation, chest discomfort  Gastrointestinal:  Doesn't report nausea, constipation, diarrhea GU: Doesn't report incontinence Skin: Doesn't report skin rashes Neurological: Per HPI Musculoskeletal: Doesn't report joint pain Behavioral/Psych: Doesn't report anxiety  Physical Exam: There were no vitals filed for this visit. KPS: 90. General: Alert, cooperative, pleasant, in no acute distress Head: Normal EENT: No conjunctival injection or scleral icterus.  Lungs: Resp effort normal Cardiac: Regular rate Abdomen: Non-distended abdomen Skin: No rashes cyanosis or petechiae. Extremities: No clubbing or edema  Neurologic Exam: Mental Status: Awake, alert, attentive to examiner. Oriented to self and environment. Language is fluent with intact comprehension.  Cranial Nerves: Visual acuity is grossly normal. Visual fields are full. Extra-ocular movements intact. No ptosis. Face is symmetric Motor: Tone and bulk are normal. Power is full in both arms and legs. Reflexes are symmetric, no pathologic reflexes present.  Sensory: Intact to light touch Gait: Normal.   Labs: I have reviewed the data as listed    Component Value Date/Time   NA 144 12/20/2019 1941   K 4.1 12/20/2019 1941   CL 107 12/18/2019 0622   CO2 23 12/18/2019 0622   GLUCOSE 162 (H) 12/18/2019 0622   BUN 12 03/07/2020 1623   CREATININE 0.79 03/07/2020 1623    CALCIUM 9.2 12/18/2019 0622   PROT 8.1 12/18/2019 0622   ALBUMIN 3.4 (L) 12/18/2019 0622   AST 21 12/18/2019 0622   ALT 20 12/18/2019 0622   ALKPHOS 127 (H) 12/18/2019 0622   BILITOT 0.6 12/18/2019 0622   GFRNONAA >60 03/07/2020 1623   GFRAA >60 03/07/2020 1623   Lab Results  Component Value Date   WBC 13.2 (H) 12/18/2019   NEUTROABS 7.7 12/17/2019   HGB 9.2 (L) 12/20/2019   HCT 27.0 (L) 12/20/2019   MCV 77.1 (L) 12/18/2019   PLT 297 12/18/2019    Imaging: [facility] Clinician Interpretation: I have personally reviewed the CNS images as listed.  My interpretation, in the context of the patient's clinical presentation, is stable disease  MR Brain W Wo Contrast  Result Date: 10/24/2020 CLINICAL DATA:  Meningioma follow-up EXAM: MRI HEAD WITHOUT AND WITH CONTRAST TECHNIQUE: Multiplanar, multiecho pulse sequences of the brain and surrounding structures were obtained without and with intravenous contrast. CONTRAST:  13mL MULTIHANCE GADOBENATE DIMEGLUMINE 529 MG/ML IV SOLN COMPARISON:  03/13/2020 FINDINGS: Brain: Meningioma is again identified along the right lateral cerebellar convexity and undersurface of the tentorium. This measures approximately 4.1 x 3.4 x 2 cm (4.4 x 3.7 x 2.2 cm previously when remeasured in same manner). Associated susceptibility probably reflects calcification. Abuts the distal right transverse sinus and  sigmoid sinus with similar compression and probable residual nonocclusive thrombus. Mass effect on the underlying parenchyma without significant edema. Ventricles are stable in size with similar ventriculomegaly. Unchanged right cerebellar encephalomalacia underlying craniotomy. No acute infarction.  No new mass or abnormal enhancement. Vascular: Major vessel flow voids at the skull base are preserved. Skull and upper cervical spine: Right lateral suboccipital craniectomy and cranioplasty. Normal marrow signal is preserved. Sinuses/Orbits: Paranasal sinuses are aerated. Orbits  are unremarkable. Other: Sella is expanded and partially empty. Mastoid air cells are clear. IMPRESSION: Slight decrease in size of right posterior fossa meningioma since 03/13/2020. Similar mass effect. Electronically Signed   By: Macy Mis M.D.   On: 10/24/2020 14:53    Pathology: SURGICAL PATHOLOGY  CASE: [id number]  PATIENT: [facility]  Surgical Pathology Report   Clinical History: brain tumor (cm)    FINAL MICROSCOPIC DIAGNOSIS:   A. BRAIN TUMOR, RIGHT CEREBELLAR, EXCISION:  - Meningioma, WHO grade 1  - See comment   COMMENT:   By immunohistochemistry, the neoplastic cells show a blush positivity  for EMA but are negative for S100 and GFAP. Dr. Saralyn Pilar reviewed the  case and agrees with the above diagnosis.    Assessment/Plan Meningioma of cerebellum ([place]) [D32.0]  We appreciate the opportunity to participate in the care of Belinda Berg.  She is clinically and radiographically stable today.  Screening for potential clinical trials was performed and discussed using eligibility criteria for active protocols at [facility], loco-regional tertiary centers, as well as national database available on [email or web address].com.    The patient is not a candidate for a research protocol at this time due to stable disease.   We spent twenty additional minutes teaching regarding the natural history, biology, and historical experience in the treatment of brain tumors. We then discussed in detail the current recommendations for therapy focusing on the mode of administration, mechanism of action, anticipated toxicities, and quality of life issues associated with this plan. We also provided teaching sheets for the patient to take home as an additional resource.  We ask that Belinda Berg return to clinic in 6 months following next brain MRI, or sooner as needed.  All questions were answered. The patient knows to call the clinic with any problems, questions or concerns. No barriers to  learning were detected.  The total time spent in the encounter was 45 minutes and more than 50% was on counseling and review of test results   Ventura Sellers, MD Medical Director of Neuro-Oncology [facility] at [place] 10/28/20 10:09 AM

## 2021-04-14 ENCOUNTER — Other Ambulatory Visit: Payer: Self-pay | Admitting: Radiation Therapy

## 2021-04-25 ENCOUNTER — Other Ambulatory Visit: Payer: 59

## 2021-04-28 ENCOUNTER — Inpatient Hospital Stay: Payer: 59 | Admitting: Internal Medicine

## 2021-04-28 ENCOUNTER — Inpatient Hospital Stay: Payer: 59

## 2021-05-06 ENCOUNTER — Ambulatory Visit
Admission: RE | Admit: 2021-05-06 | Discharge: 2021-05-06 | Disposition: A | Discharge status: Home / Self Care | Payer: 59 | Source: Ambulatory Visit | Attending: Internal Medicine | Admitting: Internal Medicine

## 2021-05-06 DIAGNOSIS — D320 Benign neoplasm of cerebral meninges: Secondary | ICD-10-CM

## 2021-05-06 MED ORDER — GADOBENATE DIMEGLUMINE 529 MG/ML IV SOLN
15.0000 mL | Freq: Once | INTRAVENOUS | Status: AC | PRN
  Administered 2021-05-06: 15 mL via INTRAVENOUS

## 2021-05-08 ENCOUNTER — Inpatient Hospital Stay: Payer: 59 | Attending: Internal Medicine | Admitting: Internal Medicine

## 2021-05-08 ENCOUNTER — Inpatient Hospital Stay: Payer: 59

## 2021-05-08 ENCOUNTER — Other Ambulatory Visit: Payer: Self-pay

## 2021-05-08 VITALS — BP 208/111 | HR 100 | Temp 98.1°F | Resp 18 | Ht 60.0 in | Wt 152.7 lb

## 2021-05-08 DIAGNOSIS — I10 Essential (primary) hypertension: Secondary | ICD-10-CM | POA: Diagnosis not present

## 2021-05-08 DIAGNOSIS — D320 Benign neoplasm of cerebral meninges: Secondary | ICD-10-CM | POA: Diagnosis present

## 2021-05-08 MED ORDER — CLONIDINE HCL 0.1 MG PO TABS
0.1000 mg | ORAL_TABLET | Freq: Once | ORAL | Status: AC
  Administered 2021-05-08: 0.1 mg via ORAL

## 2021-05-08 NOTE — Progress Notes (Signed)
Patient was given clonidine 0.1 mg in office for BP reading 209/118 (right arm).  Waited in office for 30 minutes.  Blood pressure recheck was 199/120 right arm, 208/111 left arm.    Spoke with daughter Minette Brine and advised that Dr Mickeal Skinner recommends that the patient go to the emergency room for further evaluation.  Daughter is on the way to pick patient up and will discuss with patient.  She understands the recommendation by the doctor and staff.

## 2021-05-08 NOTE — Progress Notes (Signed)
Received call from patients daughter she called to state that she would not be able to pick up the patient and wanted [place] to call 911.  Patient has refused to go to the ER and has been waiting 3 hours to be picked up.  Explained to the daughter that patient has the right to refuse however we do recommend that she go to the emergency room.  Patient needs to be picked up and they can call 911 when she agrees to seek medical attention.

## 2021-05-08 NOTE — Progress Notes (Signed)
[facility] at [address] [place], [place] [address] [phone]   Interval Evaluation  Date of Service: 05/08/21 Patient Name: Belinda Berg Patient MRN: [id number] Patient DOB: May 18, 1956 Provider: Ventura Sellers, MD  Identifying Statement:  Belinda Berg is a 65 y.o. female with posterior fossa meningioma   Oncologic History: 12/20/19: Sub-occipital craniotomy, debulking resection w/ Dr. Zada Finders.  Path is grade I histology. 04/29/20: Completes 6 weeks IMRT with Dr. Isidore Moos   Interval History: Belinda Berg presents today for follow up after recent MRI brain.  She describes no new or progressive deficits.  No headaches or seizures.  H+P (10/28/20) Patient presented to medical attention initially in May of last year with new onset progressive headaches.  She did not have significant headache history; CNS imaging demonstrated large dural based mass in the R cerebellar hemisphere with compression and hydrocephalus.  She underwent debulking craniotomy with Dr. Zada Finders on 12/19/20; path demonstrated grade I meningioma.  This was followed by IMRT with Dr. Isidore Moos, which was completed in October of this past year.  She denies headaches, seizures, balance issues today.  Daughter acknowledges she is more easily fatigued and "slower with memory" compared to prior.  She currently cares for her 8 year old grandson, and is overall active and independent.   Medications: Current Outpatient Medications on File Prior to Visit  Medication Sig Dispense Refill   amLODipine (NORVASC) 10 MG tablet Take 10 mg by mouth daily.     atorvastatin (LIPITOR) 40 MG tablet Take 40 mg by mouth at bedtime.     carvedilol (COREG) 6.25 MG tablet Take 6.25 mg by mouth 2 (two) times daily.     hydrochlorothiazide (HYDRODIURIL) 25 MG tablet Take 25 mg by mouth daily.     losartan (COZAAR) 50 MG tablet Take 50 mg by mouth daily.     Multiple Vitamin (MULTIVITAMIN WITH MINERALS) TABS  tablet Take 1 tablet by mouth daily.     No current facility-administered medications on file prior to visit.    Allergies: No Known Allergies Past Medical History:  Past Medical History:  Diagnosis Date   Hypertension    Past Surgical History:  Past Surgical History:  Procedure Laterality Date   APPLICATION OF CRANIAL NAVIGATION N/A 12/20/2019   Procedure: APPLICATION OF CRANIAL NAVIGATION;  Surgeon: Judith Part, MD;  Location: [facility];  Service: Neurosurgery;  Laterality: N/A;   CRANIOTOMY Right 12/20/2019   Procedure: CRANIOTOMY TUMOR RESECTION;  Surgeon: Judith Part, MD;  Location: [facility];  Service: Neurosurgery;  Laterality: Right;   ESOPHAGOGASTRODUODENOSCOPY (EGD) WITH PROPOFOL N/A 09/06/2020   Procedure: ESOPHAGOGASTRODUODENOSCOPY (EGD) WITH PROPOFOL;  Surgeon: Carol Ada, MD;  [facility];  Service: Endoscopy;  Laterality: N/A;   HEMOSTASIS CLIP PLACEMENT  09/06/2020   Procedure: HEMOSTASIS CLIP PLACEMENT;  Surgeon: Carol Ada, MD;  [facility];  Service: Endoscopy;;   POLYPECTOMY  09/06/2020   Procedure: POLYPECTOMY;  Surgeon: Carol Ada, MD;  [facility];  Service: Endoscopy;;   Social History:  Social History   Socioeconomic History   Marital status: Single    Spouse name: Not on file   Number of children: Not on file   Years of education: Not on file   Highest education level: Not on file  Occupational History   Not on file  Tobacco Use   Smoking status: Never   Smokeless tobacco: Never  Vaping Use   Vaping Use: Never used  Substance and Sexual  Activity   Alcohol use: Never   Drug use: Never   Sexual activity: Not Currently  Other Topics Concern   Not on file  Social History Narrative   Not on file   Social Determinants of Health   Financial Resource Strain: Not on file  Food Insecurity: Not on file  Transportation Needs: Not on file  Physical Activity: Not on file  Stress: Not on file  Social  Connections: Not on file  Intimate Partner Violence: Not on file   Family History: No family history on file.  Review of Systems: Constitutional: Doesn't report fevers, chills or abnormal weight loss Eyes: Doesn't report blurriness of vision Ears, nose, mouth, throat, and face: Doesn't report sore throat Respiratory: Doesn't report cough, dyspnea or wheezes Cardiovascular: Doesn't report palpitation, chest discomfort  Gastrointestinal:  Doesn't report nausea, constipation, diarrhea GU: Doesn't report incontinence Skin: Doesn't report skin rashes Neurological: Per HPI Musculoskeletal: Doesn't report joint pain Behavioral/Psych: Doesn't report anxiety  Physical Exam: There were no vitals filed for this visit. KPS: 90. General: Alert, cooperative, pleasant, in no acute distress Head: Normal EENT: No conjunctival injection or scleral icterus.  Lungs: Resp effort normal Cardiac: Regular rate Abdomen: Non-distended abdomen Skin: No rashes cyanosis or petechiae. Extremities: No clubbing or edema  Neurologic Exam: Mental Status: Awake, alert, attentive to examiner. Oriented to self and environment. Language is fluent with intact comprehension.  Cranial Nerves: Visual acuity is grossly normal. Visual fields are full. Extra-ocular movements intact. No ptosis. Face is symmetric Motor: Tone and bulk are normal. Power is full in both arms and legs. Reflexes are symmetric, no pathologic reflexes present.  Sensory: Intact to light touch Gait: Normal.   Labs: I have reviewed the data as listed    Component Value Date/Time   NA 144 12/20/2019 1941   K 4.1 12/20/2019 1941   CL 107 12/18/2019 0622   CO2 23 12/18/2019 0622   GLUCOSE 162 (H) 12/18/2019 0622   BUN 12 03/07/2020 1623   CREATININE 0.79 03/07/2020 1623   CALCIUM 9.2 12/18/2019 0622   PROT 8.1 12/18/2019 0622   ALBUMIN 3.4 (L) 12/18/2019 0622   AST 21 12/18/2019 0622   ALT 20 12/18/2019 0622   ALKPHOS 127 (H) 12/18/2019  0622   BILITOT 0.6 12/18/2019 0622   GFRNONAA >60 03/07/2020 1623   GFRAA >60 03/07/2020 1623   Lab Results  Component Value Date   WBC 13.2 (H) 12/18/2019   NEUTROABS 7.7 12/17/2019   HGB 9.2 (L) 12/20/2019   HCT 27.0 (L) 12/20/2019   MCV 77.1 (L) 12/18/2019   PLT 297 12/18/2019    Imaging: [facility] Clinician Interpretation: I have personally reviewed the CNS images as listed.  My interpretation, in the context of the patient's clinical presentation, is stable disease  MR BRAIN W WO CONTRAST  Result Date: 05/06/2021 CLINICAL DATA:  Meningioma follow-up EXAM: MRI HEAD WITHOUT AND WITH CONTRAST TECHNIQUE: Multiplanar, multiecho pulse sequences of the brain and surrounding structures were obtained without and with intravenous contrast. CONTRAST:  70mL MULTIHANCE GADOBENATE DIMEGLUMINE 529 MG/ML IV SOLN COMPARISON:  10/24/2020 FINDINGS: Brain: 4.2 x 3.6 x 2.1 cm mass from theunderside of the right tentorium which is unchanged with mass effect on the right cerebellum. No change in adjacent transverse sinus narrowing close to the craniotomy. No porous acousticus or bone invasion. No infarct, hemorrhage or hydrocephalus. Vascular: As above. Skull and upper cervical spine: Unremarkable chronic right occipital craniotomy. Sinuses/Orbits: Negative. IMPRESSION: Stable right inferior tentorial meningioma. Electronically Signed  By: Jorje Guild M.D.   On: 05/06/2021 22:20      Assessment/Plan Meningioma of cerebellum [facility]) [D32.0]  Belinda Berg is clinically and radiographically stable today.  No new or progressive neurologic deficits.   0.1mg  PO clonidine was administered in clinic today because of elevated blood pressure.  We ask that Belinda Berg return to clinic in 12 months following next brain MRI, or sooner as needed.  All questions were answered. The patient knows to call the clinic with any problems, questions or concerns. No barriers to learning were detected.  The total time  spent in the encounter was 30 minutes and more than 50% was on counseling and review of test results   Ventura Sellers, MD Medical Director of Neuro-Oncology [facility] at [place] 05/08/21 10:34 AM

## 2021-05-08 NOTE — Progress Notes (Signed)
Blood pressure medication given for elevated BP of 209/118.  Patient will remain and have bp rechecked 30 minutes after given.

## 2021-05-12 ENCOUNTER — Inpatient Hospital Stay: Payer: 59

## 2021-05-12 ENCOUNTER — Other Ambulatory Visit: Payer: Self-pay | Admitting: Radiation Therapy

## 2022-04-13 DIAGNOSIS — E876 Hypokalemia: Secondary | ICD-10-CM | POA: Diagnosis not present

## 2022-04-13 DIAGNOSIS — I10 Essential (primary) hypertension: Secondary | ICD-10-CM | POA: Diagnosis not present

## 2022-04-13 DIAGNOSIS — E559 Vitamin D deficiency, unspecified: Secondary | ICD-10-CM | POA: Diagnosis not present

## 2022-04-13 DIAGNOSIS — K219 Gastro-esophageal reflux disease without esophagitis: Secondary | ICD-10-CM | POA: Diagnosis not present

## 2022-04-13 DIAGNOSIS — L309 Dermatitis, unspecified: Secondary | ICD-10-CM | POA: Diagnosis not present

## 2022-04-13 DIAGNOSIS — E782 Mixed hyperlipidemia: Secondary | ICD-10-CM | POA: Diagnosis not present

## 2022-04-27 DIAGNOSIS — L309 Dermatitis, unspecified: Secondary | ICD-10-CM | POA: Diagnosis not present

## 2022-04-27 DIAGNOSIS — E782 Mixed hyperlipidemia: Secondary | ICD-10-CM | POA: Diagnosis not present

## 2022-04-27 DIAGNOSIS — E559 Vitamin D deficiency, unspecified: Secondary | ICD-10-CM | POA: Diagnosis not present

## 2022-04-27 DIAGNOSIS — I10 Essential (primary) hypertension: Secondary | ICD-10-CM | POA: Diagnosis not present

## 2022-04-27 DIAGNOSIS — Z23 Encounter for immunization: Secondary | ICD-10-CM | POA: Diagnosis not present

## 2022-04-27 DIAGNOSIS — K219 Gastro-esophageal reflux disease without esophagitis: Secondary | ICD-10-CM | POA: Diagnosis not present

## 2022-05-05 ENCOUNTER — Ambulatory Visit (HOSPITAL_COMMUNITY)
Admission: RE | Admit: 2022-05-05 | Discharge: 2022-05-05 | Disposition: A | Discharge status: Home / Self Care | Payer: 59 | Source: Ambulatory Visit | Attending: Internal Medicine | Admitting: Internal Medicine

## 2022-05-05 DIAGNOSIS — D320 Benign neoplasm of cerebral meninges: Secondary | ICD-10-CM | POA: Diagnosis not present

## 2022-05-05 DIAGNOSIS — G9389 Other specified disorders of brain: Secondary | ICD-10-CM | POA: Diagnosis not present

## 2022-05-05 DIAGNOSIS — Z9889 Other specified postprocedural states: Secondary | ICD-10-CM | POA: Diagnosis not present

## 2022-05-05 DIAGNOSIS — R220 Localized swelling, mass and lump, head: Secondary | ICD-10-CM | POA: Diagnosis not present

## 2022-05-05 DIAGNOSIS — D329 Benign neoplasm of meninges, unspecified: Secondary | ICD-10-CM | POA: Diagnosis not present

## 2022-05-05 MED ORDER — GADOBUTROL 1 MMOL/ML IV SOLN
7.0000 mL | Freq: Once | INTRAVENOUS | Status: AC | PRN
  Administered 2022-05-05: 7 mL via INTRAVENOUS

## 2022-05-07 ENCOUNTER — Inpatient Hospital Stay: Payer: 59 | Attending: Internal Medicine | Admitting: Internal Medicine

## 2022-05-07 ENCOUNTER — Other Ambulatory Visit: Payer: Self-pay

## 2022-05-07 VITALS — BP 169/94 | HR 122 | Temp 98.3°F | Resp 17 | Wt 169.3 lb

## 2022-05-07 DIAGNOSIS — I10 Essential (primary) hypertension: Secondary | ICD-10-CM | POA: Diagnosis not present

## 2022-05-07 DIAGNOSIS — D320 Benign neoplasm of cerebral meninges: Secondary | ICD-10-CM | POA: Insufficient documentation

## 2022-05-07 NOTE — Progress Notes (Signed)
[facility] [place], [place] [address] [phone]   Interval Evaluation  Date of Service: 05/07/22 Patient Name: Belinda Berg Patient MRN: [id number] Patient DOB: 08/30/1955 Provider: Ventura Sellers, MD  Identifying Statement:  Belinda Berg is a 66 y.o. female with posterior fossa meningioma   Oncologic History: 12/20/19: Sub-occipital craniotomy, debulking resection w/ Dr. Zada Finders.  Path is grade I histology. 04/29/20: Completes 6 weeks IMRT with Dr. Isidore Moos   Interval History: Belinda Berg presents today for follow up after recent MRI brain.  No clinical changes today.  No headaches or seizures.  H+P (10/28/20) Patient presented to medical attention initially in May of last year with new onset progressive headaches.  She did not have significant headache history; CNS imaging demonstrated large dural based mass in the R cerebellar hemisphere with compression and hydrocephalus.  She underwent debulking craniotomy with Dr. Zada Finders on 12/19/20; path demonstrated grade I meningioma.  This was followed by IMRT with Dr. Isidore Moos, which was completed in October of this past year.  She denies headaches, seizures, balance issues today.  Daughter acknowledges she is more easily fatigued and "slower with memory" compared to prior.  She currently cares for her 52 year old grandson, and is overall active and independent.   Medications: Current Outpatient Medications on File Prior to Visit  Medication Sig Dispense Refill   atorvastatin (LIPITOR) 40 MG tablet Take 40 mg by mouth at bedtime.     carvedilol (COREG) 6.25 MG tablet Take 6.25 mg by mouth 2 (two) times daily.     hydrochlorothiazide (HYDRODIURIL) 25 MG tablet Take 25 mg by mouth daily.     omeprazole (PRILOSEC) 20 MG capsule Take 20 mg by mouth daily.     No current facility-administered medications on file prior to visit.    Allergies: No Known Allergies Past Medical History:   Past Medical History:  Diagnosis Date   Hypertension    Past Surgical History:  Past Surgical History:  Procedure Laterality Date   APPLICATION OF CRANIAL NAVIGATION N/A 12/20/2019   Procedure: APPLICATION OF CRANIAL NAVIGATION;  Surgeon: Judith Part, MD;  Location: [facility];  Service: Neurosurgery;  Laterality: N/A;   CRANIOTOMY Right 12/20/2019   Procedure: CRANIOTOMY TUMOR RESECTION;  Surgeon: Judith Part, MD;  Location: [facility];  Service: Neurosurgery;  Laterality: Right;   ESOPHAGOGASTRODUODENOSCOPY (EGD) WITH PROPOFOL N/A 09/06/2020   Procedure: ESOPHAGOGASTRODUODENOSCOPY (EGD) WITH PROPOFOL;  Surgeon: Carol Ada, MD;  Location: [facility];  Service: Endoscopy;  Laterality: N/A;   HEMOSTASIS CLIP PLACEMENT  09/06/2020   Procedure: HEMOSTASIS CLIP PLACEMENT;  Surgeon: Carol Ada, MD;  [facility];  Service: Endoscopy;;   POLYPECTOMY  09/06/2020   Procedure: POLYPECTOMY;  Surgeon: Carol Ada, MD;  [facility];  Service: Endoscopy;;   Social History:  Social History   Socioeconomic History   Marital status: Single    Spouse name: Not on file   Number of children: Not on file   Years of education: Not on file   Highest education level: Not on file  Occupational History   Not on file  Tobacco Use   Smoking status: Never   Smokeless tobacco: Never  Vaping Use   Vaping Use: Never used  Substance and Sexual Activity   Alcohol use: Never   Drug use: Never   Sexual activity: Not Currently  Other Topics Concern   Not on file  Social History Narrative   Not on file  Social Determinants of Health   Financial Resource Strain: Not on file  Food Insecurity: Not on file  Transportation Needs: Not on file  Physical Activity: Not on file  Stress: Not on file  Social Connections: Not on file  Intimate Partner Violence: Not on file   Family History: No family history on file.  Review of Systems: Constitutional: Doesn't report fevers,  chills or abnormal weight loss Eyes: Doesn't report blurriness of vision Ears, nose, mouth, throat, and face: Doesn't report sore throat Respiratory: Doesn't report cough, dyspnea or wheezes Cardiovascular: Doesn't report palpitation, chest discomfort  Gastrointestinal:  Doesn't report nausea, constipation, diarrhea GU: Doesn't report incontinence Skin: Doesn't report skin rashes Neurological: Per HPI Musculoskeletal: Doesn't report joint pain Behavioral/Psych: Doesn't report anxiety  Physical Exam: Vitals:   05/07/22 1018  BP: (!) 169/94  Pulse: (!) 122  Resp: 17  Temp: 98.3 F (36.8 C)  SpO2: 99%   KPS: 90. General: Alert, cooperative, pleasant, in no acute distress Head: Normal EENT: No conjunctival injection or scleral icterus.  Lungs: Resp effort normal Cardiac: Regular rate Abdomen: Non-distended abdomen Skin: No rashes cyanosis or petechiae. Extremities: No clubbing or edema  Neurologic Exam: Mental Status: Awake, alert, attentive to examiner. Oriented to self and environment. Language is fluent with intact comprehension.  Cranial Nerves: Visual acuity is grossly normal. Visual fields are full. Extra-ocular movements intact. No ptosis. Face is symmetric Motor: Tone and bulk are normal. Power is full in both arms and legs. Reflexes are symmetric, no pathologic reflexes present.  Sensory: Intact to light touch Gait: Normal.   Labs: I have reviewed the data as listed    Component Value Date/Time   NA 144 12/20/2019 1941   K 4.1 12/20/2019 1941   CL 107 12/18/2019 0622   CO2 23 12/18/2019 0622   GLUCOSE 162 (H) 12/18/2019 0622   BUN 12 03/07/2020 1623   CREATININE 0.79 03/07/2020 1623   CALCIUM 9.2 12/18/2019 0622   PROT 8.1 12/18/2019 0622   ALBUMIN 3.4 (L) 12/18/2019 0622   AST 21 12/18/2019 0622   ALT 20 12/18/2019 0622   ALKPHOS 127 (H) 12/18/2019 0622   BILITOT 0.6 12/18/2019 0622   GFRNONAA >60 03/07/2020 1623   GFRAA >60 03/07/2020 1623   Lab  Results  Component Value Date   WBC 13.2 (H) 12/18/2019   NEUTROABS 7.7 12/17/2019   HGB 9.2 (L) 12/20/2019   HCT 27.0 (L) 12/20/2019   MCV 77.1 (L) 12/18/2019   PLT 297 12/18/2019    Imaging: [facility] Clinician Interpretation: I have personally reviewed the CNS images as listed.  My interpretation, in the context of the patient's clinical presentation, is stable disease  MR BRAIN W WO CONTRAST  Result Date: 05/06/2022 CLINICAL DATA:  Follow-up meningioma EXAM: MRI HEAD WITHOUT AND WITH CONTRAST TECHNIQUE: Multiplanar, multiecho pulse sequences of the brain and surrounding structures were obtained without and with intravenous contrast. CONTRAST:  40m GADAVIST GADOBUTROL 1 MMOL/ML IV SOLN COMPARISON:  MRI head 05/06/2021 FINDINGS: Brain: Densely enhancing extra-axial mass along the undersurface of the tentorium on the right is stable. The enhancing mass measures 37 x 31 x 12 mm. There is encephalomalacia in the right lateral cerebellum, stable. Postsurgical changes the retromastoid craniotomy on the right. The mass shows mild mineralization. Mild ventricular enlargement stable and may be due to atrophy. No significant compression of the fourth ventricle. No acute infarct. Mild white matter hyperintensities. Vascular: Normal arterial flow voids. The mass abuts the right transverse sinus which remains patent.  Skull and upper cervical spine: Right retromastoid craniotomy. Sinuses/Orbits: Negative Other: None IMPRESSION: 1. Densely enhancing extra-axial mass along the undersurface of the tentorium on the right is stable and compatible with meningioma. 2. Stable encephalomalacia in the right cerebellum. Electronically Signed   By: Franchot Gallo M.D.   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  No new or progressive findings.  We ask that Emmalyne Giacomo return to clinic in 12 months following next  brain MRI, or sooner as needed.  All questions were answered. The patient knows to call the clinic with any problems, questions or concerns. No barriers to learning were detected.  The total time spent in the encounter was 30 minutes and more than 50% was on counseling and review of test results   Ventura Sellers, MD Medical Director of Neuro-Oncology [facility] at [place] 05/07/22 10:21 AM

## 2022-05-12 ENCOUNTER — Other Ambulatory Visit: Payer: Self-pay | Admitting: Radiation Therapy

## 2022-07-07 IMAGING — CT CT HEAD W/O CM
3 series · 15 of 45 positions shown, 18 images · non-contrast
Comparison: 12/22/2019.  12/17/2019.

CLINICAL DATA: Surgery 12/21/2019 for posterior fossa mass.
Question hydrocephalus.

EXAM:
CT HEAD WITHOUT CONTRAST
TECHNIQUE: Contiguous axial images were obtained from the base of the skull
through the vertex without intravenous contrast.

[Series 2: head wo · axial · 0.41mm/px · z∈[-77,+38]mm · 9 of 28 slices shown, 12 images]
[im 3/28  brain]
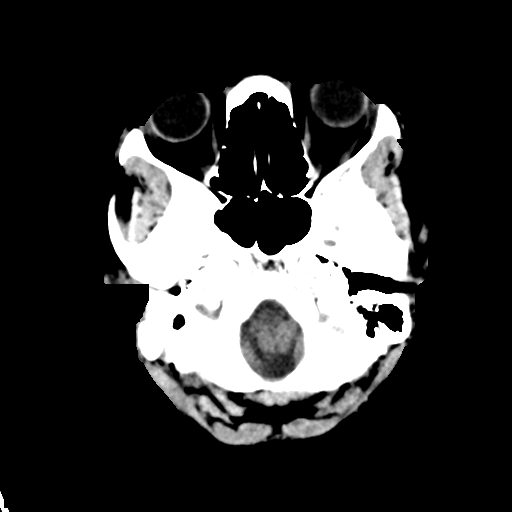
[im 3/28  bone]
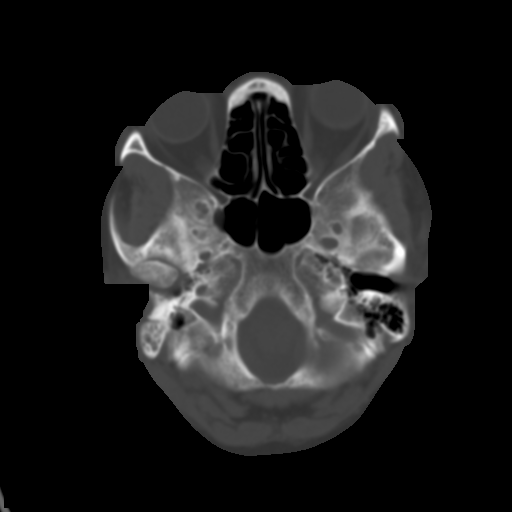
[im 6/28  brain]
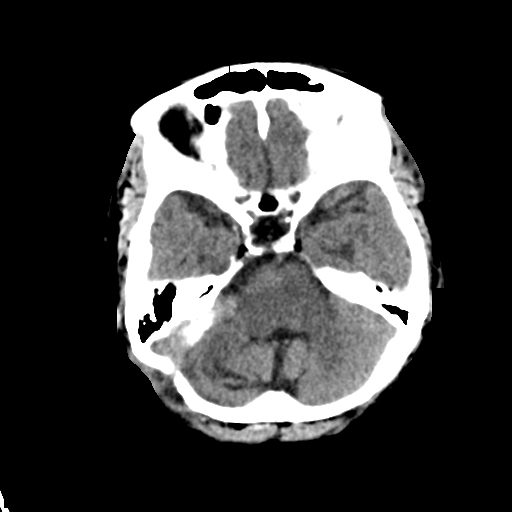
[im 9/28  brain]
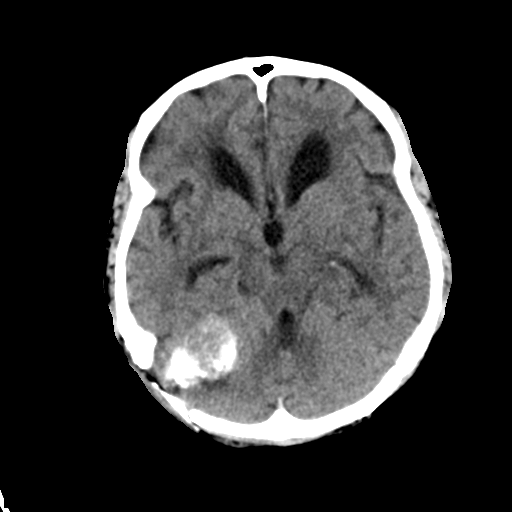
[im 12/28  brain]
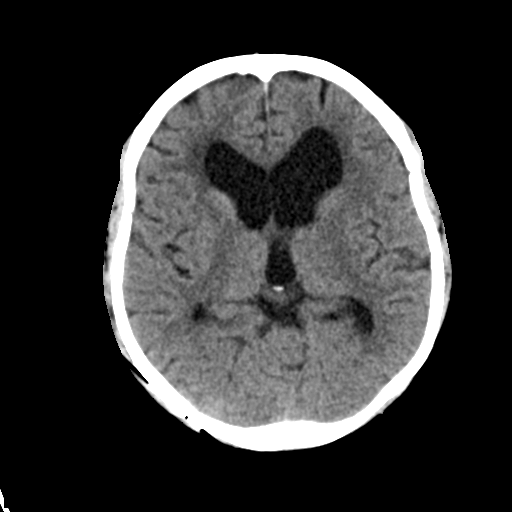
[im 15/28  brain]
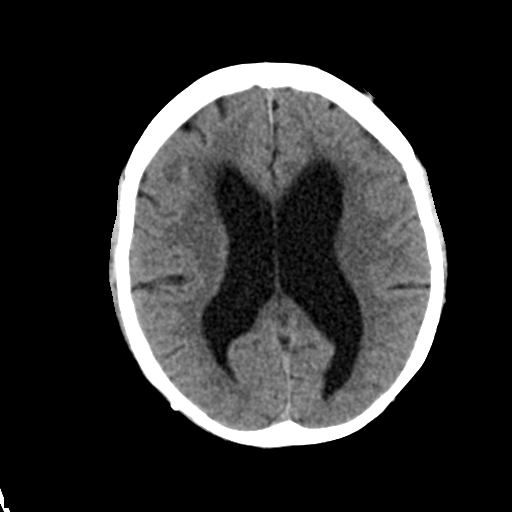
[im 15/28  bone]
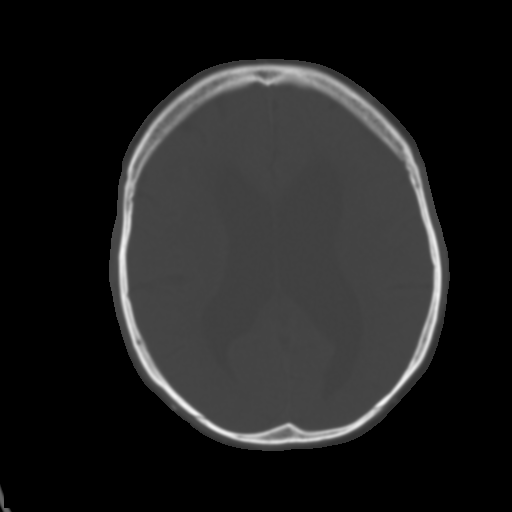
[im 17/28  brain]
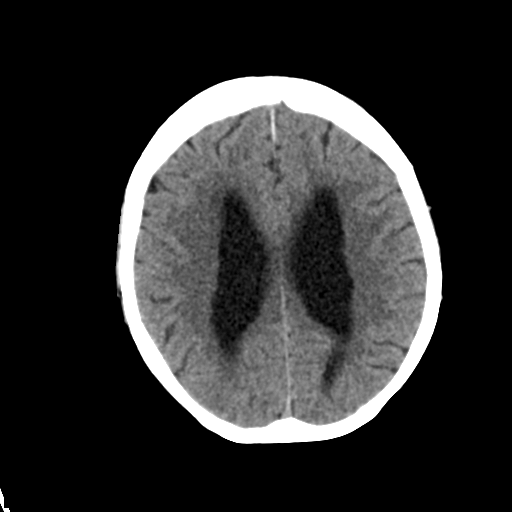
[im 20/28  brain]
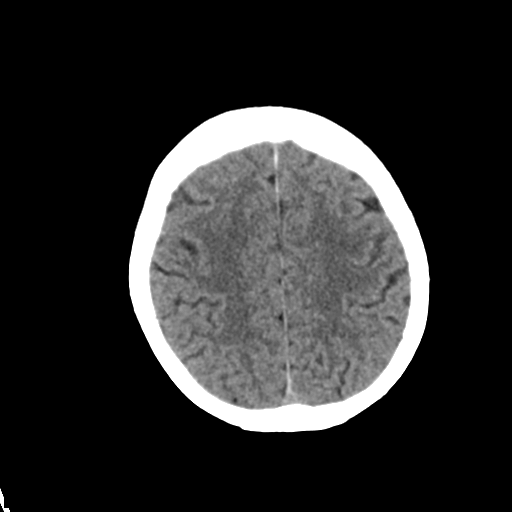
[im 23/28  brain]
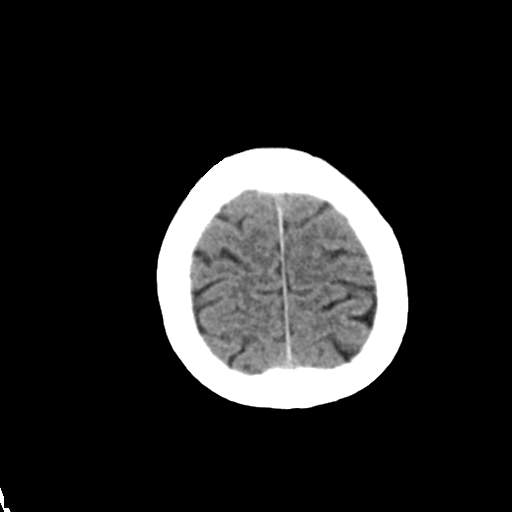
[im 26/28  brain]
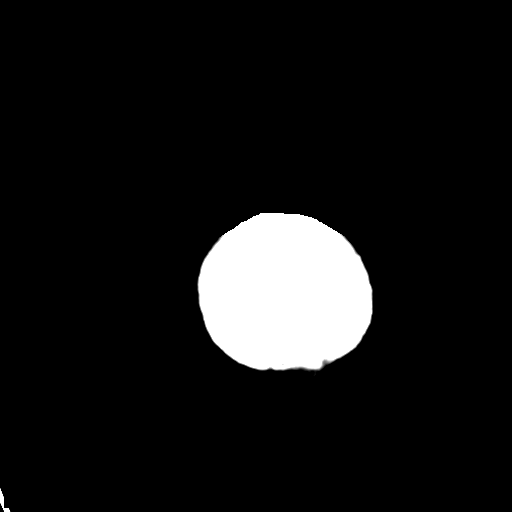
[im 26/28  bone]
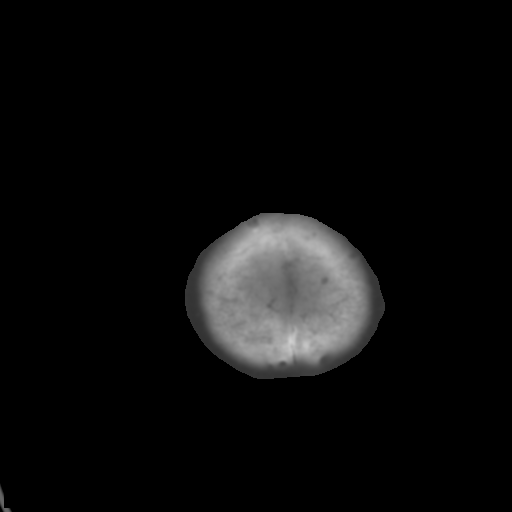

[Series 4: coronal soft tissue · coronal · 0.29mm/px · 3 of 64 slices shown]
[im 22/64  brain]
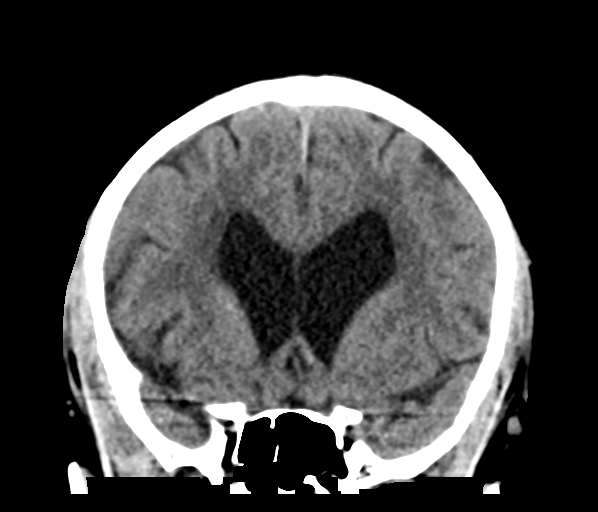
[im 29/64  brain]
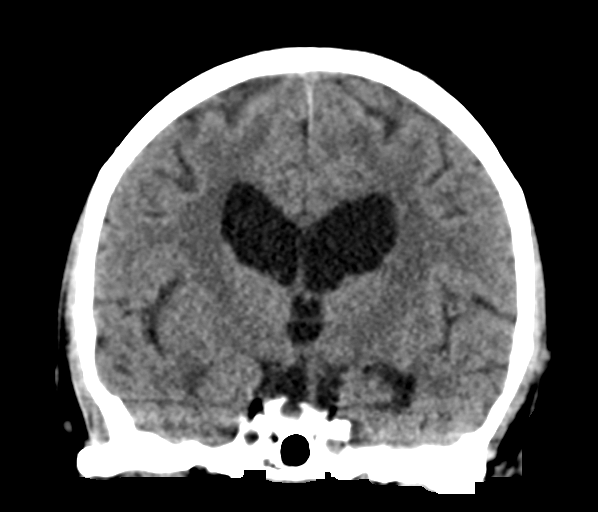
[im 36/64  brain]
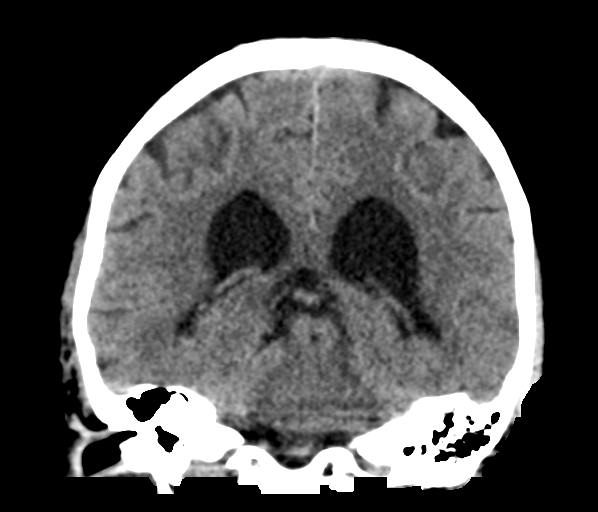

[Series 5: sagittal soft tissue · sagittal · 0.29mm/px · 3 of 59 slices shown]
[im 20/59  brain]
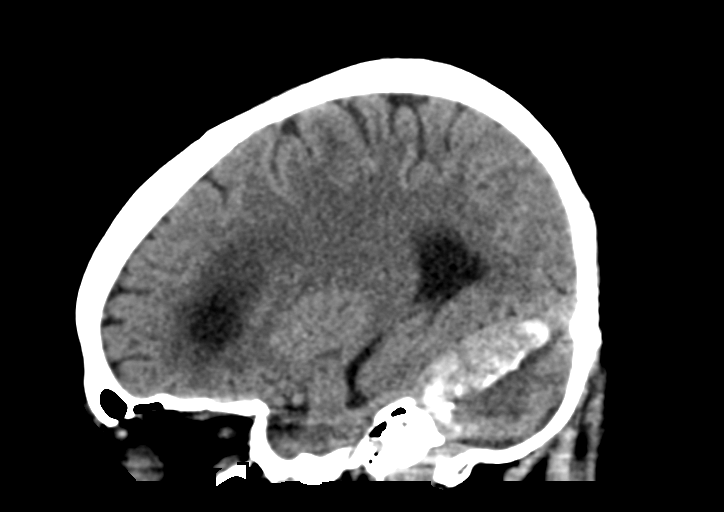
[im 30/59  brain]
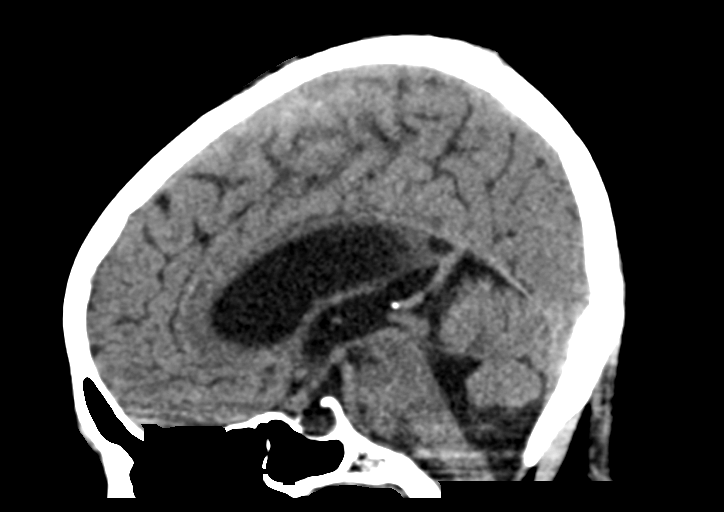
[im 39/59  brain]
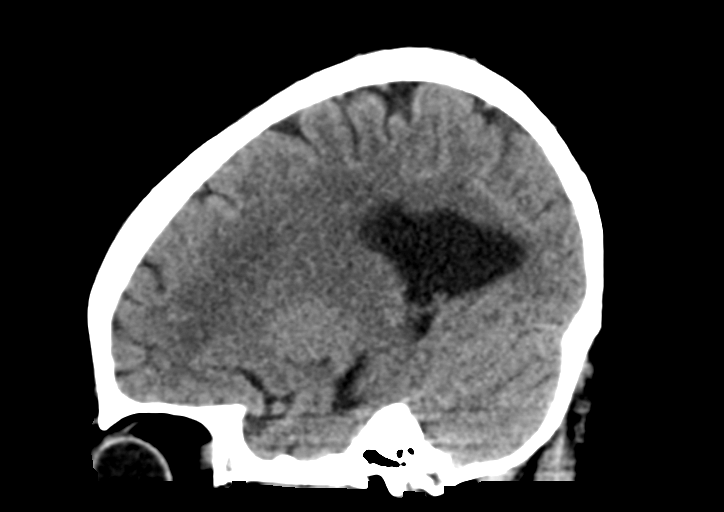

[15 of 45 positions shown; findings below may reference images not displayed]

FINDINGS: Brain: Previous right occipital craniectomy and cranioplasty for
debulking of a right lateral posterior fossa calcified meningioma.
Previously the tumor measured 3.6-3.8 cm in diameter. Today,
residual tumor measures 3.3 x 2.0 cm. Less mass effect upon the
cerebellum and fourth ventricle. Mild edema adjacent to the tumor.
Decrease in size of the lateral and third ventricles. Frontal horn
of the left lateral ventricle is decreased in diameter from 3 cm to
2.2 cm. Third ventricle is reduced from a right to left diameter of
17 mm to 13 mm. No evidence of hemorrhage. No extra-axial
collection.

Vascular: There is atherosclerotic calcification of the major
vessels at the base of the brain.

Skull: Otherwise negative

Sinuses/Orbits: Clear/normal

Other: None
IMPRESSION: Debulking the large calcified right lateral posterior fossa
meningioma. Residual tumor along the lateral extent. Reduction in
width of the tumor from 3.2 cm to 2.0 cm. Less mass effect upon the
cerebellum and fourth ventricle. Reduction in size of the lateral
and third ventricles as measured above.

## 2022-09-10 DIAGNOSIS — E782 Mixed hyperlipidemia: Secondary | ICD-10-CM | POA: Diagnosis not present

## 2022-09-10 DIAGNOSIS — I10 Essential (primary) hypertension: Secondary | ICD-10-CM | POA: Diagnosis not present

## 2022-09-10 DIAGNOSIS — E559 Vitamin D deficiency, unspecified: Secondary | ICD-10-CM | POA: Diagnosis not present

## 2022-09-10 DIAGNOSIS — Z136 Encounter for screening for cardiovascular disorders: Secondary | ICD-10-CM | POA: Diagnosis not present

## 2022-09-10 DIAGNOSIS — E569 Vitamin deficiency, unspecified: Secondary | ICD-10-CM | POA: Diagnosis not present

## 2022-09-10 DIAGNOSIS — Z1329 Encounter for screening for other suspected endocrine disorder: Secondary | ICD-10-CM | POA: Diagnosis not present

## 2022-09-10 DIAGNOSIS — K219 Gastro-esophageal reflux disease without esophagitis: Secondary | ICD-10-CM | POA: Diagnosis not present

## 2022-09-10 DIAGNOSIS — Z131 Encounter for screening for diabetes mellitus: Secondary | ICD-10-CM | POA: Diagnosis not present

## 2022-09-10 DIAGNOSIS — Z0001 Encounter for general adult medical examination with abnormal findings: Secondary | ICD-10-CM | POA: Diagnosis not present

## 2022-09-10 DIAGNOSIS — L309 Dermatitis, unspecified: Secondary | ICD-10-CM | POA: Diagnosis not present

## 2022-10-15 DIAGNOSIS — E559 Vitamin D deficiency, unspecified: Secondary | ICD-10-CM | POA: Diagnosis not present

## 2022-10-15 DIAGNOSIS — I10 Essential (primary) hypertension: Secondary | ICD-10-CM | POA: Diagnosis not present

## 2022-10-15 DIAGNOSIS — E782 Mixed hyperlipidemia: Secondary | ICD-10-CM | POA: Diagnosis not present

## 2022-10-15 DIAGNOSIS — K219 Gastro-esophageal reflux disease without esophagitis: Secondary | ICD-10-CM | POA: Diagnosis not present

## 2022-10-15 DIAGNOSIS — L309 Dermatitis, unspecified: Secondary | ICD-10-CM | POA: Diagnosis not present

## 2023-04-15 DIAGNOSIS — L309 Dermatitis, unspecified: Secondary | ICD-10-CM | POA: Diagnosis not present

## 2023-04-15 DIAGNOSIS — I10 Essential (primary) hypertension: Secondary | ICD-10-CM | POA: Diagnosis not present

## 2023-04-15 DIAGNOSIS — E782 Mixed hyperlipidemia: Secondary | ICD-10-CM | POA: Diagnosis not present

## 2023-04-15 DIAGNOSIS — K219 Gastro-esophageal reflux disease without esophagitis: Secondary | ICD-10-CM | POA: Diagnosis not present

## 2023-04-15 DIAGNOSIS — E559 Vitamin D deficiency, unspecified: Secondary | ICD-10-CM | POA: Diagnosis not present

## 2023-04-21 DIAGNOSIS — I10 Essential (primary) hypertension: Secondary | ICD-10-CM | POA: Diagnosis not present

## 2023-04-21 DIAGNOSIS — L309 Dermatitis, unspecified: Secondary | ICD-10-CM | POA: Diagnosis not present

## 2023-04-21 DIAGNOSIS — K219 Gastro-esophageal reflux disease without esophagitis: Secondary | ICD-10-CM | POA: Diagnosis not present

## 2023-04-21 DIAGNOSIS — E782 Mixed hyperlipidemia: Secondary | ICD-10-CM | POA: Diagnosis not present

## 2023-04-21 DIAGNOSIS — E559 Vitamin D deficiency, unspecified: Secondary | ICD-10-CM | POA: Diagnosis not present

## 2023-04-29 DIAGNOSIS — I10 Essential (primary) hypertension: Secondary | ICD-10-CM | POA: Diagnosis not present

## 2023-04-29 DIAGNOSIS — E782 Mixed hyperlipidemia: Secondary | ICD-10-CM | POA: Diagnosis not present

## 2023-04-29 DIAGNOSIS — L309 Dermatitis, unspecified: Secondary | ICD-10-CM | POA: Diagnosis not present

## 2023-04-29 DIAGNOSIS — Z23 Encounter for immunization: Secondary | ICD-10-CM | POA: Diagnosis not present

## 2023-04-29 DIAGNOSIS — K219 Gastro-esophageal reflux disease without esophagitis: Secondary | ICD-10-CM | POA: Diagnosis not present

## 2023-04-29 DIAGNOSIS — E559 Vitamin D deficiency, unspecified: Secondary | ICD-10-CM | POA: Diagnosis not present

## 2023-05-03 ENCOUNTER — Telehealth: Payer: Self-pay | Admitting: *Deleted

## 2023-05-03 NOTE — Telephone Encounter (Signed)
Prior Authorization was obtained and found that patient's insurance will not allow the scan to be done at [facility].  Attempted to call patients daughter to advise, lm pending returned call.   Patients insurance approved scan to be done at:  [facility] [place] [address] [place], [place], [address]   Patient needs to call [phone] to schedule scan.  I faxed prior authorization over to [facility] just now.     Scan canceled at [facility].

## 2023-05-04 ENCOUNTER — Telehealth: Payer: Self-pay | Admitting: *Deleted

## 2023-05-04 NOTE — Telephone Encounter (Signed)
PC to patient's daughter Zollie Scale - informed her patient has brain MRI ordered which her insurance is requiring that she have done at [facility].  Phone number given so daughter can schedule MRI - [phone].  Informed Zollie Scale that patient has appointment here with Dr Barbaraann Cao on 05/13/23 & that scan needs to be done prior to this visit.  She verbalizes understanding & will call back with MRI date.

## 2023-05-04 NOTE — Telephone Encounter (Signed)
PC received from patient's daughter Zollie Scale - she states patient is scheduled for her brain MRI at [facility] on 05/12/23 at 1:30.  Informed daughter our scheduling department will contact her to schedule F/U with Dr Barbaraann Cao.  She verbalizes understanding, scheduling message sent.

## 2023-05-06 ENCOUNTER — Ambulatory Visit (HOSPITAL_COMMUNITY): Payer: 59

## 2023-05-11 ENCOUNTER — Telehealth: Payer: Self-pay | Admitting: *Deleted

## 2023-05-11 NOTE — Telephone Encounter (Signed)
PC to patient's daughter, Zollie Scale - no answer, left VM.  Informed Zollie Scale that patient has appointment with Dr Barbaraann Cao to discuss MRI results on 05/18/23 at 9:00 am.  Celesta Aver to contact this office with any questions/concerns, [phone].

## 2023-05-12 DIAGNOSIS — R220 Localized swelling, mass and lump, head: Secondary | ICD-10-CM | POA: Diagnosis not present

## 2023-05-12 DIAGNOSIS — D320 Benign neoplasm of cerebral meninges: Secondary | ICD-10-CM | POA: Diagnosis not present

## 2023-05-13 ENCOUNTER — Telehealth: Payer: Self-pay | Admitting: *Deleted

## 2023-05-13 ENCOUNTER — Other Ambulatory Visit: Payer: Self-pay | Admitting: *Deleted

## 2023-05-13 ENCOUNTER — Telehealth: Payer: Self-pay | Admitting: Internal Medicine

## 2023-05-13 ENCOUNTER — Inpatient Hospital Stay
Admission: RE | Admit: 2023-05-13 | Discharge: 2023-05-13 | Disposition: A | Discharge status: Home / Self Care | Payer: Self-pay | Source: Ambulatory Visit | Attending: Internal Medicine | Admitting: Internal Medicine

## 2023-05-13 ENCOUNTER — Ambulatory Visit: Payer: 59 | Admitting: Internal Medicine

## 2023-05-13 DIAGNOSIS — D320 Benign neoplasm of cerebral meninges: Secondary | ICD-10-CM

## 2023-05-13 NOTE — Telephone Encounter (Signed)
Requested that images from brain MRI at [facility] on 05/12/23 be sent to [facility] .

## 2023-05-13 NOTE — Telephone Encounter (Signed)
Called and spoke with patient's daughter, patient will be notified of 10/31 appointments.

## 2023-05-18 ENCOUNTER — Ambulatory Visit: Payer: 59 | Admitting: Internal Medicine

## 2023-05-20 ENCOUNTER — Inpatient Hospital Stay: Payer: 59 | Attending: Internal Medicine | Admitting: Internal Medicine

## 2023-05-20 VITALS — BP 156/92 | HR 92 | Temp 98.1°F | Resp 18 | Wt 168.2 lb

## 2023-05-20 DIAGNOSIS — D320 Benign neoplasm of cerebral meninges: Secondary | ICD-10-CM | POA: Diagnosis not present

## 2023-05-20 NOTE — Progress Notes (Signed)
[facility] at [facility] [address]  [place], [place] [address] [phone]   Interval Evaluation  Date of Service: 05/20/23 Patient Name: Belinda Berg Patient MRN: [id number] Patient DOB: 13-Jul-1956 Provider: Henreitta Leber, MD  Identifying Statement:  Belinda Berg is a 67 y.o. female with posterior fossa meningioma   Oncologic History: 12/20/19: Sub-occipital craniotomy, debulking resection w/ Dr. Maurice Small.  Path is grade I histology. 04/29/20: Completes 6 weeks IMRT with Dr. Basilio Cairo   Interval History: Belinda Berg presents today for follow up after recent MRI brain.  Denies new or progressive changes.  No headaches or seizures.  H+P (10/28/20) Patient presented to medical attention initially in May of last year with new onset progressive headaches.  She did not have significant headache history; CNS imaging demonstrated large dural based mass in the R cerebellar hemisphere with compression and hydrocephalus.  She underwent debulking craniotomy with Dr. Maurice Small on 12/19/20; path demonstrated grade I meningioma.  This was followed by IMRT with Dr. Basilio Cairo, which was completed in October of this past year.  She denies headaches, seizures, balance issues today.  Daughter acknowledges she is more easily fatigued and "slower with memory" compared to prior.  She currently cares for her 54 year old grandson, and is overall active and independent.   Medications: Current Outpatient Medications on File Prior to Visit  Medication Sig Dispense Refill   atorvastatin (LIPITOR) 40 MG tablet Take 40 mg by mouth at bedtime.     carvedilol (COREG) 6.25 MG tablet Take 6.25 mg by mouth 2 (two) times daily.     hydrochlorothiazide (HYDRODIURIL) 25 MG tablet Take 25 mg by mouth daily.     omeprazole (PRILOSEC) 20 MG capsule Take 20 mg by mouth daily.     No current facility-administered medications on file prior to visit.    Allergies: No Known Allergies Past Medical  History:  Past Medical History:  Diagnosis Date   Hypertension    Past Surgical History:  Past Surgical History:  Procedure Laterality Date   APPLICATION OF CRANIAL NAVIGATION N/A 12/20/2019   Procedure: APPLICATION OF CRANIAL NAVIGATION;  Surgeon: Jadene Pierini, MD;  Location: [facility];  Service: Neurosurgery;  Laterality: N/A;   CRANIOTOMY Right 12/20/2019   Procedure: CRANIOTOMY TUMOR RESECTION;  Surgeon: Jadene Pierini, MD;  Location: [facility];  Service: Neurosurgery;  Laterality: Right;   ESOPHAGOGASTRODUODENOSCOPY (EGD) WITH PROPOFOL N/A 09/06/2020   Procedure: ESOPHAGOGASTRODUODENOSCOPY (EGD) WITH PROPOFOL;  Surgeon: Jeani Hawking, MD;  Location: [facility];  Service: Endoscopy;  Laterality: N/A;   HEMOSTASIS CLIP PLACEMENT  09/06/2020   Procedure: HEMOSTASIS CLIP PLACEMENT;  Surgeon: Jeani Hawking, MD;  Location: [facility];  Service: Endoscopy;;   POLYPECTOMY  09/06/2020   Procedure: POLYPECTOMY;  Surgeon: Jeani Hawking, MD;  Location: [facility];  Service: Endoscopy;;   Social History:  Social History   Socioeconomic History   Marital status: Single    Spouse name: Not on file   Number of children: Not on file   Years of education: Not on file   Highest education level: Not on file  Occupational History   Not on file  Tobacco Use   Smoking status: Never   Smokeless tobacco: Never  Vaping Use   Vaping status: Never Used  Substance and Sexual Activity   Alcohol use: Never   Drug use: Never   Sexual activity: Not Currently  Other Topics Concern   Not on file  Social History Narrative   Not on  file   Social Determinants of Health   Financial Resource Strain: Not on file  Food Insecurity: Not on file  Transportation Needs: Not on file  Physical Activity: Not on file  Stress: Not on file  Social Connections: Not on file  Intimate Partner Violence: Not on file   Family History: No family history on file.  Review of Systems: Constitutional: Doesn't  report fevers, chills or abnormal weight loss Eyes: Doesn't report blurriness of vision Ears, nose, mouth, throat, and face: Doesn't report sore throat Respiratory: Doesn't report cough, dyspnea or wheezes Cardiovascular: Doesn't report palpitation, chest discomfort  Gastrointestinal:  Doesn't report nausea, constipation, diarrhea GU: Doesn't report incontinence Skin: Doesn't report skin rashes Neurological: Per HPI Musculoskeletal: Doesn't report joint pain Behavioral/Psych: Doesn't report anxiety  Physical Exam: Vitals:   05/20/23 0915  BP: (!) 156/92  Pulse: 92  Resp: 18  Temp: 98.1 F (36.7 C)  SpO2: 99%    KPS: 90. General: Alert, cooperative, pleasant, in no acute distress Head: Normal EENT: No conjunctival injection or scleral icterus.  Lungs: Resp effort normal Cardiac: Regular rate Abdomen: Non-distended abdomen Skin: No rashes cyanosis or petechiae. Extremities: No clubbing or edema  Neurologic Exam: Mental Status: Awake, alert, attentive to examiner. Oriented to self and environment. Language is fluent with intact comprehension.  Cranial Nerves: Visual acuity is grossly normal. Visual fields are full. Extra-ocular movements intact. No ptosis. Face is symmetric Motor: Tone and bulk are normal. Power is full in both arms and legs. Reflexes are symmetric, no pathologic reflexes present.  Sensory: Intact to light touch Gait: Normal.   Labs: I have reviewed the data as listed    Component Value Date/Time   NA 144 12/20/2019 1941   K 4.1 12/20/2019 1941   CL 107 12/18/2019 0622   CO2 23 12/18/2019 0622   GLUCOSE 162 (H) 12/18/2019 0622   BUN 12 03/07/2020 1623   CREATININE 0.79 03/07/2020 1623   CALCIUM 9.2 12/18/2019 0622   PROT 8.1 12/18/2019 0622   ALBUMIN 3.4 (L) 12/18/2019 0622   AST 21 12/18/2019 0622   ALT 20 12/18/2019 0622   ALKPHOS 127 (H) 12/18/2019 0622   BILITOT 0.6 12/18/2019 0622   GFRNONAA >60 03/07/2020 1623   GFRAA >60 03/07/2020 1623    Lab Results  Component Value Date   WBC 13.2 (H) 12/18/2019   NEUTROABS 7.7 12/17/2019   HGB 9.2 (L) 12/20/2019   HCT 27.0 (L) 12/20/2019   MCV 77.1 (L) 12/18/2019   PLT 297 12/18/2019    Imaging: [facility] Clinician Interpretation: I have personally reviewed the CNS images as listed.  My interpretation, in the context of the patient's clinical presentation, is stable disease pending official read  No results found.    Assessment/Plan Meningioma of cerebellum ([facility]) [D32.0]  Reva Bourgoyne is clinically and radiographically stable today.  MRI demonstrates stable findings, pending official read.  No new or progressive findings clinically.  We ask that Dandre Baringer return to clinic in 12 months following next brain MRI, or sooner as needed.  All questions were answered. The patient knows to call the clinic with any problems, questions or concerns. No barriers to learning were detected.  The total time spent in the encounter was 30 minutes and more than 50% was on counseling and review of test results   Henreitta Leber, MD Medical Director of Neuro-Oncology [facility] at [facility] 05/20/23 9:19 AM

## 2023-09-16 DIAGNOSIS — L309 Dermatitis, unspecified: Secondary | ICD-10-CM | POA: Diagnosis not present

## 2023-09-16 DIAGNOSIS — E559 Vitamin D deficiency, unspecified: Secondary | ICD-10-CM | POA: Diagnosis not present

## 2023-09-16 DIAGNOSIS — K219 Gastro-esophageal reflux disease without esophagitis: Secondary | ICD-10-CM | POA: Diagnosis not present

## 2023-09-16 DIAGNOSIS — Z131 Encounter for screening for diabetes mellitus: Secondary | ICD-10-CM | POA: Diagnosis not present

## 2023-09-16 DIAGNOSIS — E782 Mixed hyperlipidemia: Secondary | ICD-10-CM | POA: Diagnosis not present

## 2023-09-16 DIAGNOSIS — Z0001 Encounter for general adult medical examination with abnormal findings: Secondary | ICD-10-CM | POA: Diagnosis not present

## 2023-09-16 DIAGNOSIS — I10 Essential (primary) hypertension: Secondary | ICD-10-CM | POA: Diagnosis not present

## 2023-10-14 DIAGNOSIS — I10 Essential (primary) hypertension: Secondary | ICD-10-CM | POA: Diagnosis not present

## 2023-10-14 DIAGNOSIS — K219 Gastro-esophageal reflux disease without esophagitis: Secondary | ICD-10-CM | POA: Diagnosis not present

## 2023-10-14 DIAGNOSIS — L309 Dermatitis, unspecified: Secondary | ICD-10-CM | POA: Diagnosis not present

## 2023-10-14 DIAGNOSIS — E782 Mixed hyperlipidemia: Secondary | ICD-10-CM | POA: Diagnosis not present

## 2023-10-14 DIAGNOSIS — E559 Vitamin D deficiency, unspecified: Secondary | ICD-10-CM | POA: Diagnosis not present

## 2023-10-14 DIAGNOSIS — E876 Hypokalemia: Secondary | ICD-10-CM | POA: Diagnosis not present

## 2023-10-14 DIAGNOSIS — D508 Other iron deficiency anemias: Secondary | ICD-10-CM | POA: Diagnosis not present

## 2024-01-05 ENCOUNTER — Other Ambulatory Visit: Payer: Self-pay | Admitting: Physician Assistant

## 2024-01-05 DIAGNOSIS — Z1231 Encounter for screening mammogram for malignant neoplasm of breast: Secondary | ICD-10-CM

## 2024-01-06 DIAGNOSIS — E782 Mixed hyperlipidemia: Secondary | ICD-10-CM | POA: Diagnosis not present

## 2024-01-06 DIAGNOSIS — E876 Hypokalemia: Secondary | ICD-10-CM | POA: Diagnosis not present

## 2024-01-06 DIAGNOSIS — D508 Other iron deficiency anemias: Secondary | ICD-10-CM | POA: Diagnosis not present

## 2024-01-06 DIAGNOSIS — K219 Gastro-esophageal reflux disease without esophagitis: Secondary | ICD-10-CM | POA: Diagnosis not present

## 2024-01-06 DIAGNOSIS — L309 Dermatitis, unspecified: Secondary | ICD-10-CM | POA: Diagnosis not present

## 2024-01-06 DIAGNOSIS — E559 Vitamin D deficiency, unspecified: Secondary | ICD-10-CM | POA: Diagnosis not present

## 2024-01-06 DIAGNOSIS — I10 Essential (primary) hypertension: Secondary | ICD-10-CM | POA: Diagnosis not present

## 2024-02-04 ENCOUNTER — Ambulatory Visit

## 2024-02-22 ENCOUNTER — Ambulatory Visit
Admission: RE | Admit: 2024-02-22 | Discharge: 2024-02-22 | Disposition: A | Discharge status: Home / Self Care | Source: Ambulatory Visit | Attending: Physician Assistant | Admitting: Physician Assistant

## 2024-02-22 DIAGNOSIS — Z1231 Encounter for screening mammogram for malignant neoplasm of breast: Secondary | ICD-10-CM | POA: Diagnosis not present

## 2024-03-09 ENCOUNTER — Other Ambulatory Visit: Payer: Self-pay | Admitting: Radiation Therapy

## 2024-04-06 DIAGNOSIS — E559 Vitamin D deficiency, unspecified: Secondary | ICD-10-CM | POA: Diagnosis not present

## 2024-04-06 DIAGNOSIS — L309 Dermatitis, unspecified: Secondary | ICD-10-CM | POA: Diagnosis not present

## 2024-04-06 DIAGNOSIS — Z23 Encounter for immunization: Secondary | ICD-10-CM | POA: Diagnosis not present

## 2024-04-06 DIAGNOSIS — K219 Gastro-esophageal reflux disease without esophagitis: Secondary | ICD-10-CM | POA: Diagnosis not present

## 2024-04-06 DIAGNOSIS — E782 Mixed hyperlipidemia: Secondary | ICD-10-CM | POA: Diagnosis not present

## 2024-04-06 DIAGNOSIS — I10 Essential (primary) hypertension: Secondary | ICD-10-CM | POA: Diagnosis not present

## 2024-04-13 ENCOUNTER — Telehealth: Payer: Self-pay | Admitting: *Deleted

## 2024-04-13 NOTE — Telephone Encounter (Signed)
 PC to patient's daughter Schuyler - informed her patient's MRI has been scheduled on 05/09/24 at 10:00 at [facility], she is to arrive at 9:30.  Her F/U appointment with Dr [place] is 05/22/24 at 9:00.  She verbalizes understanding.

## 2024-05-08 DIAGNOSIS — Z008 Encounter for other general examination: Secondary | ICD-10-CM | POA: Diagnosis not present

## 2024-05-09 ENCOUNTER — Ambulatory Visit (HOSPITAL_BASED_OUTPATIENT_CLINIC_OR_DEPARTMENT_OTHER)
Admission: RE | Admit: 2024-05-09 | Discharge: 2024-05-09 | Disposition: A | Discharge status: Home / Self Care | Source: Ambulatory Visit | Attending: Internal Medicine | Admitting: Internal Medicine

## 2024-05-09 DIAGNOSIS — R9089 Other abnormal findings on diagnostic imaging of central nervous system: Secondary | ICD-10-CM | POA: Diagnosis not present

## 2024-05-09 DIAGNOSIS — D320 Benign neoplasm of cerebral meninges: Secondary | ICD-10-CM | POA: Diagnosis present

## 2024-05-09 DIAGNOSIS — R9082 White matter disease, unspecified: Secondary | ICD-10-CM | POA: Diagnosis not present

## 2024-05-09 MED ORDER — GADOBUTROL 1 MMOL/ML IV SOLN
7.5000 mL | Freq: Once | INTRAVENOUS | Status: AC | PRN
  Administered 2024-05-09: 7.5 mL via INTRAVENOUS

## 2024-05-15 ENCOUNTER — Encounter

## 2024-05-22 ENCOUNTER — Inpatient Hospital Stay: Payer: 59 | Attending: Internal Medicine | Admitting: Internal Medicine

## 2024-05-22 VITALS — BP 166/83 | HR 121 | Temp 98.5°F | Resp 18 | Ht 60.0 in | Wt 170.7 lb

## 2024-05-22 DIAGNOSIS — Z79899 Other long term (current) drug therapy: Secondary | ICD-10-CM | POA: Diagnosis not present

## 2024-05-22 DIAGNOSIS — D320 Benign neoplasm of cerebral meninges: Secondary | ICD-10-CM

## 2024-05-22 DIAGNOSIS — I10 Essential (primary) hypertension: Secondary | ICD-10-CM | POA: Diagnosis not present

## 2024-05-22 NOTE — Progress Notes (Signed)
 [facility] at [facility] [address]  [place], [place] [address] [phone]   Interval Evaluation  Date of Service: 05/22/24 Patient Name: Belinda Berg Patient MRN: [id number] Patient DOB: Mar 15, 1956 Provider: Arthea MARLA Manns, MD  Identifying Statement:  Belinda Berg is a 68 y.o. female with posterior fossa meningioma   Oncologic History: 12/20/19: Sub-occipital craniotomy, debulking resection w/ Dr. Cheryle.  Path is grade I histology. 04/29/20: Completes 6 weeks IMRT with Dr. Izell   Interval History: Belinda Berg presents today for follow up after recent MRI brain.  No novel complaints today, though daughter does note some short term memory difficulties.  No headaches or seizures.  H+P (10/28/20) Patient presented to medical attention initially in May of last year with new onset progressive headaches.  She did not have significant headache history; CNS imaging demonstrated large dural based mass in the R cerebellar hemisphere with compression and hydrocephalus.  She underwent debulking craniotomy with Dr. Cheryle on 12/19/20; path demonstrated grade I meningioma.  This was followed by IMRT with Dr. Izell, which was completed in October of this past year.  She denies headaches, seizures, balance issues today.  Daughter acknowledges she is more easily fatigued and slower with memory compared to prior.  She currently cares for her 93 year old grandson, and is overall active and independent.   Medications: Current Outpatient Medications on File Prior to Visit  Medication Sig Dispense Refill   atorvastatin  (LIPITOR) 40 MG tablet Take 40 mg by mouth at bedtime.     carvedilol  (COREG ) 6.25 MG tablet Take 6.25 mg by mouth 2 (two) times daily.     hydrochlorothiazide (HYDRODIURIL) 25 MG tablet Take 25 mg by mouth daily.     omeprazole (PRILOSEC) 20 MG capsule Take 20 mg by mouth daily.     No current facility-administered medications on file prior to  visit.    Allergies: No Known Allergies Past Medical History:  Past Medical History:  Diagnosis Date   Hypertension    Past Surgical History:  Past Surgical History:  Procedure Laterality Date   APPLICATION OF CRANIAL NAVIGATION N/A 12/20/2019   Procedure: APPLICATION OF CRANIAL NAVIGATION;  Surgeon: Cheryle Debby LABOR, MD;  Location: [facility] OR;  Service: Neurosurgery;  Laterality: N/A;   CRANIOTOMY Right 12/20/2019   Procedure: CRANIOTOMY TUMOR RESECTION;  Surgeon: Cheryle Debby LABOR, MD;  Location: [facility] OR;  Service: Neurosurgery;  Laterality: Right;   ESOPHAGOGASTRODUODENOSCOPY (EGD) WITH PROPOFOL  N/A 09/06/2020   Procedure: ESOPHAGOGASTRODUODENOSCOPY (EGD) WITH PROPOFOL ;  Surgeon: Rollin Dover, MD;  Location: [facility] ENDOSCOPY;  Service: Endoscopy;  Laterality: N/A;   HEMOSTASIS CLIP PLACEMENT  09/06/2020   Procedure: HEMOSTASIS CLIP PLACEMENT;  Surgeon: Rollin Dover, MD;  Location: [facility] ENDOSCOPY;  Service: Endoscopy;;   POLYPECTOMY  09/06/2020   Procedure: POLYPECTOMY;  Surgeon: Rollin Dover, MD;  Location: [facility] ENDOSCOPY;  Service: Endoscopy;;   Social History:  Social History   Socioeconomic History   Marital status: Single    Spouse name: Not on file   Number of children: Not on file   Years of education: Not on file   Highest education level: Not on file  Occupational History   Not on file  Tobacco Use   Smoking status: Never   Smokeless tobacco: Never  Vaping Use   Vaping status: Never Used  Substance and Sexual Activity   Alcohol use: Never   Drug use: Never   Sexual activity: Not Currently  Other Topics Concern   Not on file  Social History Narrative   Not on file   Social Drivers of Health   Financial Resource Strain: Not on file  Food Insecurity: Not on file  Transportation Needs: Not on file  Physical Activity: Not on file  Stress: Not on file  Social Connections: Not on file  Intimate Partner Violence: Not on file   Family History: No family history on  file.  Review of Systems: Constitutional: Doesn't report fevers, chills or abnormal weight loss Eyes: Doesn't report blurriness of vision Ears, nose, mouth, throat, and face: Doesn't report sore throat Respiratory: Doesn't report cough, dyspnea or wheezes Cardiovascular: Doesn't report palpitation, chest discomfort  Gastrointestinal:  Doesn't report nausea, constipation, diarrhea GU: Doesn't report incontinence Skin: Doesn't report skin rashes Neurological: Per HPI Musculoskeletal: Doesn't report joint pain Behavioral/Psych: Doesn't report anxiety  Physical Exam: Vitals:   05/22/24 0907  BP: (!) 166/83  Pulse: (!) 121  Resp: 18  Temp: 98.5 F (36.9 C)  SpO2: 100%     KPS: 90. General: Alert, cooperative, pleasant, in no acute distress Head: Normal EENT: No conjunctival injection or scleral icterus.  Lungs: Resp effort normal Cardiac: Regular rate Abdomen: Non-distended abdomen Skin: No rashes cyanosis or petechiae. Extremities: No clubbing or edema  Neurologic Exam: Mental Status: Awake, alert, attentive to examiner. Oriented to self and environment. Language is fluent with intact comprehension.  Cranial Nerves: Visual acuity is grossly normal. Visual fields are full. Extra-ocular movements intact. No ptosis. Face is symmetric Motor: Tone and bulk are normal. Power is full in both arms and legs. Reflexes are symmetric, no pathologic reflexes present.  Sensory: Intact to light touch Gait: Normal.   Labs: I have reviewed the data as listed    Component Value Date/Time   NA 144 12/20/2019 1941   K 4.1 12/20/2019 1941   CL 107 12/18/2019 0622   CO2 23 12/18/2019 0622   GLUCOSE 162 (H) 12/18/2019 0622   BUN 12 03/07/2020 1623   CREATININE 0.79 03/07/2020 1623   CALCIUM  9.2 12/18/2019 0622   PROT 8.1 12/18/2019 0622   ALBUMIN  3.4 (L) 12/18/2019 0622   AST 21 12/18/2019 0622   ALT 20 12/18/2019 0622   ALKPHOS 127 (H) 12/18/2019 0622   BILITOT 0.6 12/18/2019 0622    GFRNONAA >60 03/07/2020 1623   GFRAA >60 03/07/2020 1623   Lab Results  Component Value Date   WBC 13.2 (H) 12/18/2019   NEUTROABS 7.7 12/17/2019   HGB 9.2 (L) 12/20/2019   HCT 27.0 (L) 12/20/2019   MCV 77.1 (L) 12/18/2019   PLT 297 12/18/2019    Imaging: [facility] Clinician Interpretation: I have personally reviewed the CNS images as listed.  My interpretation, in the context of the patient's clinical presentation, is stable disease pending official read  MR BRAIN W WO CONTRAST Result Date: 05/09/2024 EXAM: MRI BRAIN WITH AND WITHOUT CONTRAST 05/09/2024 10:50:00 AM TECHNIQUE: Multiplanar multisequence MRI of the head/brain was performed with and without the administration of 7.5 mL gadobutrol  (GADAVIST ) 1 MMOL/ML injection. COMPARISON: MRI of the head dated 05/12/2023. CLINICAL HISTORY: Brain/CNS neoplasm, assess treatment response. F/U on Meningioma of cerebellum. 7.5 ML Gad. FINDINGS: BRAIN AND VENTRICLES: No acute infarct. No acute intracranial hemorrhage. No mass effect or midline shift. No hydrocephalus. The sella is unremarkable. Normal flow voids. There is a lobular evidently advancing meningioma again demonstrated laterally within the right posterior fossa, which appears stable in size in the interim, measuring approximately 4.2 cm in AP diameter, 3.3 cm in transverse diameter and 1.9 cm  in craniocaudad length. There is age-related cerebral volume loss also present and mild-to-moderate periventricular and deep cerebral white matter disease. The patient is status post right suboccipital craniotomy. There is no adverse interval change. ORBITS: No acute abnormality. SINUSES: No acute abnormality. BONES AND SOFT TISSUES: Normal bone marrow signal and enhancement. No acute soft tissue abnormality. IMPRESSION: 1. Stable lobular meningioma in the right posterior fossa, measuring approximately 4.2 x 3.3 x 1.9 cm. 2. Mild-to-moderate periventricular and deep cerebral white matter disease. 3.  Age-related cerebral volume loss. 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  No new or progressive changes. MRI demonstrates stable findings.  We ask that Analayah Brooke return to clinic in 12 months following next brain MRI, or sooner as needed.  She is agreeable with this.  All questions were answered. The patient knows to call the clinic with any problems, questions or concerns. No barriers to learning were detected.  The total time spent in the encounter was 30 minutes and more than 50% was on counseling and review of test results   Arthea MARLA Manns, MD Medical Director of Neuro-Oncology [facility] 05/22/24 8:53 AM

## 2024-06-22 DIAGNOSIS — E559 Vitamin D deficiency, unspecified: Secondary | ICD-10-CM | POA: Diagnosis not present

## 2024-06-22 DIAGNOSIS — L309 Dermatitis, unspecified: Secondary | ICD-10-CM | POA: Diagnosis not present

## 2024-06-22 DIAGNOSIS — E782 Mixed hyperlipidemia: Secondary | ICD-10-CM | POA: Diagnosis not present

## 2024-06-22 DIAGNOSIS — Z23 Encounter for immunization: Secondary | ICD-10-CM | POA: Diagnosis not present

## 2024-06-22 DIAGNOSIS — I10 Essential (primary) hypertension: Secondary | ICD-10-CM | POA: Diagnosis not present

## 2024-06-22 DIAGNOSIS — Z131 Encounter for screening for diabetes mellitus: Secondary | ICD-10-CM | POA: Diagnosis not present

## 2024-06-22 DIAGNOSIS — K219 Gastro-esophageal reflux disease without esophagitis: Secondary | ICD-10-CM | POA: Diagnosis not present

## 2024-07-06 DIAGNOSIS — K219 Gastro-esophageal reflux disease without esophagitis: Secondary | ICD-10-CM | POA: Diagnosis not present

## 2024-07-06 DIAGNOSIS — L309 Dermatitis, unspecified: Secondary | ICD-10-CM | POA: Diagnosis not present

## 2024-07-06 DIAGNOSIS — E782 Mixed hyperlipidemia: Secondary | ICD-10-CM | POA: Diagnosis not present

## 2024-07-06 DIAGNOSIS — E559 Vitamin D deficiency, unspecified: Secondary | ICD-10-CM | POA: Diagnosis not present

## 2024-07-06 DIAGNOSIS — I10 Essential (primary) hypertension: Secondary | ICD-10-CM | POA: Diagnosis not present

## 2025-05-22 ENCOUNTER — Inpatient Hospital Stay: Admitting: Internal Medicine
